# Patient Record
Sex: Male | Born: 1935
Health system: Southern US, Community
[De-identification: ages and names within clinical notes are randomized; demographics above are authoritative.]

## PROBLEM LIST (undated history)

## (undated) DIAGNOSIS — R351 Nocturia: Secondary | ICD-10-CM

## (undated) DIAGNOSIS — N400 Enlarged prostate without lower urinary tract symptoms: Secondary | ICD-10-CM

## (undated) DIAGNOSIS — I10 Essential (primary) hypertension: Secondary | ICD-10-CM

## (undated) DIAGNOSIS — K219 Gastro-esophageal reflux disease without esophagitis: Secondary | ICD-10-CM

## (undated) DIAGNOSIS — E785 Hyperlipidemia, unspecified: Secondary | ICD-10-CM

## (undated) DIAGNOSIS — C61 Malignant neoplasm of prostate: Secondary | ICD-10-CM

## (undated) HISTORY — PX: CATARACT EXTRACTION W/ INTRAOCULAR LENS  IMPLANT, BILATERAL: SHX1307

## (undated) HISTORY — PX: TONSILLECTOMY: SUR1361

## (undated) HISTORY — PX: COLONOSCOPY: SHX174

---

## 1989-07-16 HISTORY — PX: OTHER SURGICAL HISTORY: SHX169

## 1989-07-16 HISTORY — PX: INGUINAL HERNIA REPAIR: SUR1180

## 1995-11-16 HISTORY — PX: LAPAROSCOPIC CHOLECYSTECTOMY: SUR755

## 1997-11-15 HISTORY — PX: PROSTATE BIOPSY: SHX241

## 2008-03-12 ENCOUNTER — Emergency Department (HOSPITAL_COMMUNITY): Admission: EM | Admit: 2008-03-12 | Discharge: 2008-03-12 | Payer: Self-pay | Admitting: Emergency Medicine

## 2008-03-14 ENCOUNTER — Ambulatory Visit: Payer: Self-pay | Admitting: Vascular Surgery

## 2008-11-18 IMAGING — CT CT HEAD W/O CM
1 series · 16 of 30 positions shown, 20 images · non-contrast
Comparison: None

CLINICAL DATA: Dizziness, blurred vision, vomiting

CT HEAD WITHOUT CONTRAST
TECHNIQUE: Contiguous axial images were obtained from the base of
the skull through the vertex without contrast.

[Series 2: brain · axial · 0.49mm/px · z∈[+148,+290]mm · 16 of 32 slices shown, 20 images]
[im 2/32  brain]
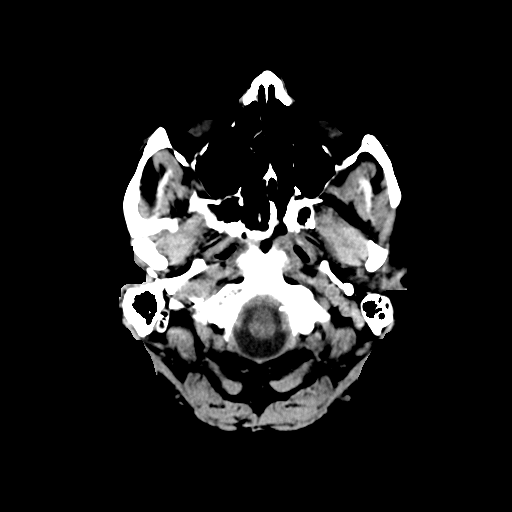
[im 2/32  bone]
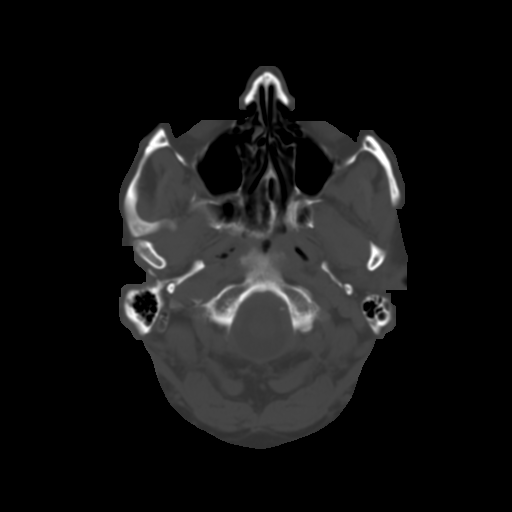
[im 4/32  brain]
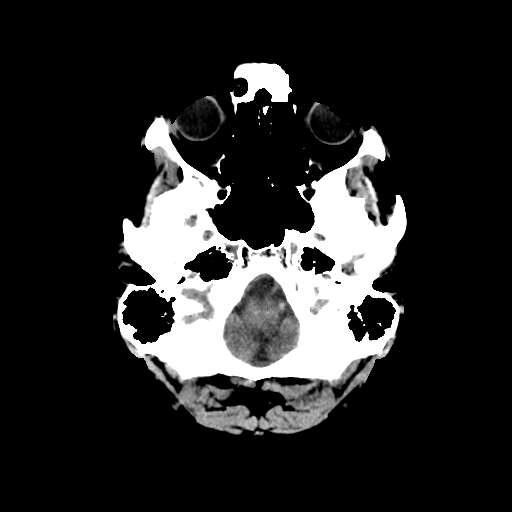
[im 6/32  brain]
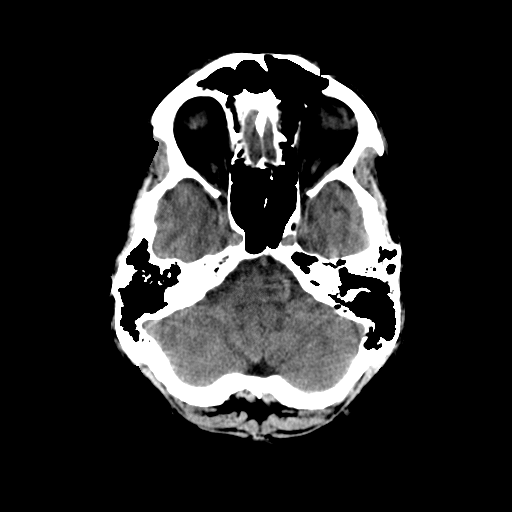
[im 8/32  brain]
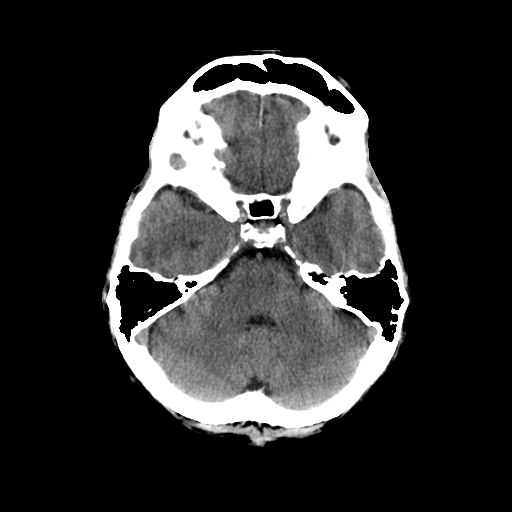
[im 9/32  brain]
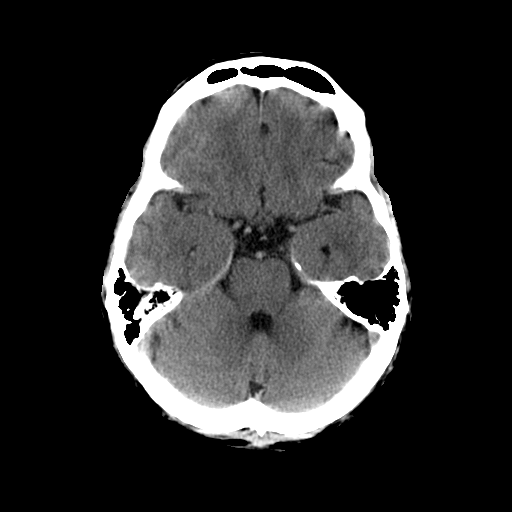
[im 9/32  bone]
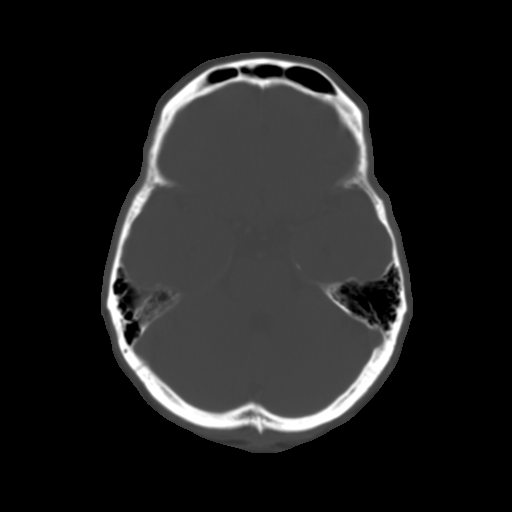
[im 11/32  brain]
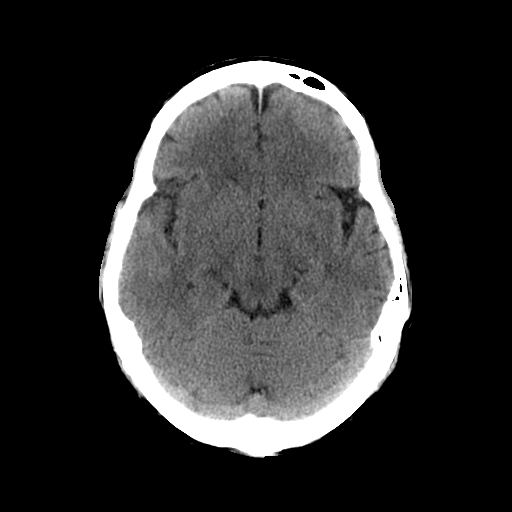
[im 13/32  brain]
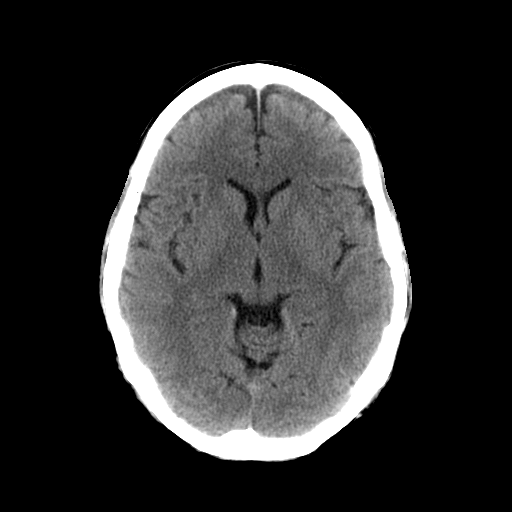
[im 15/32  brain]
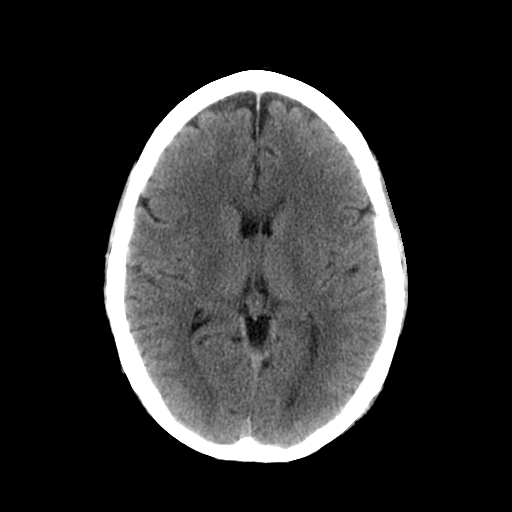
[im 17/32  brain]
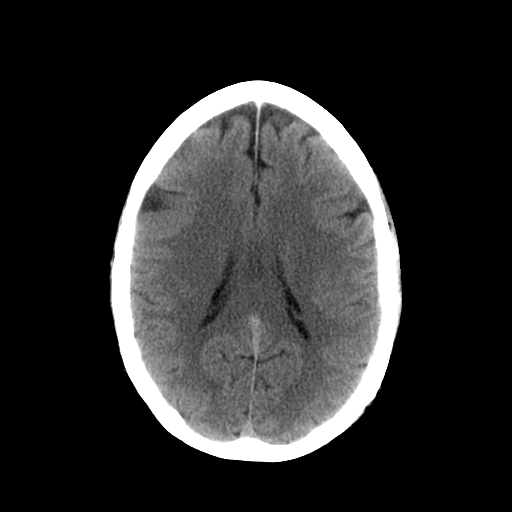
[im 17/32  bone]
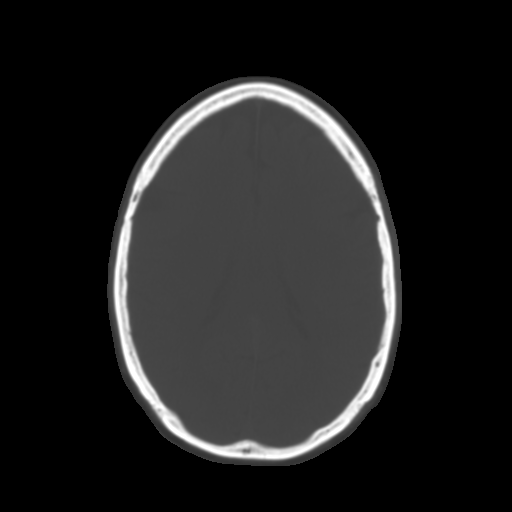
[im 19/32  brain]
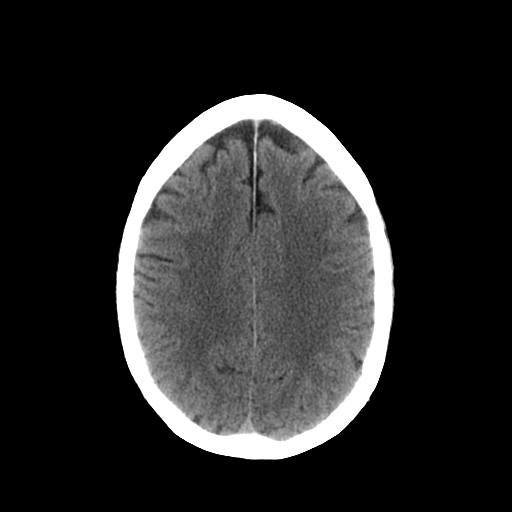
[im 21/32  brain]
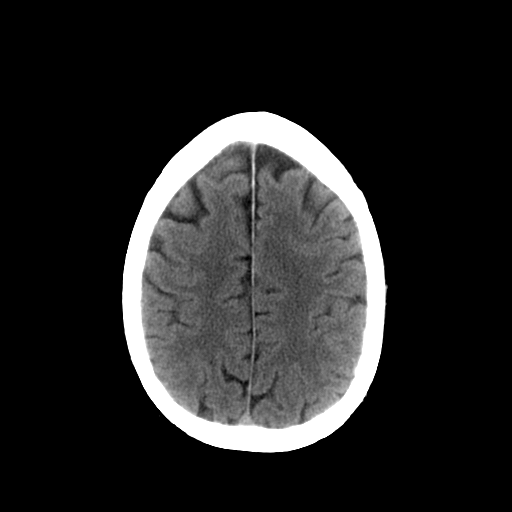
[im 23/32  brain]
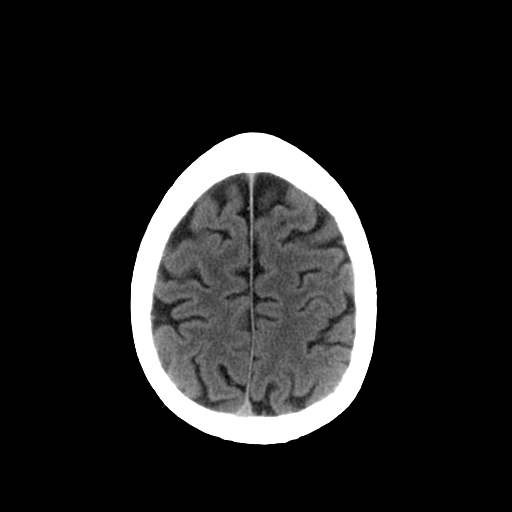
[im 24/32  brain]
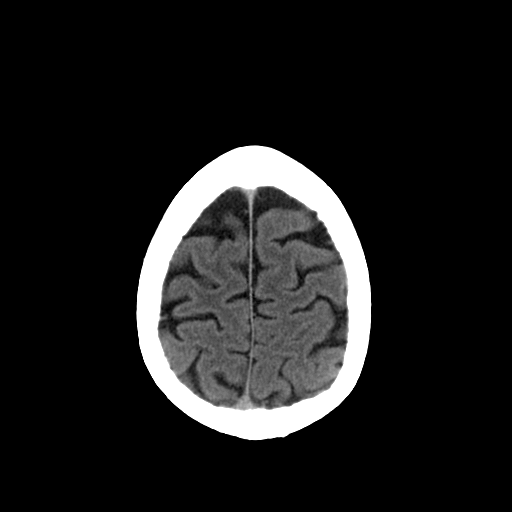
[im 24/32  bone]
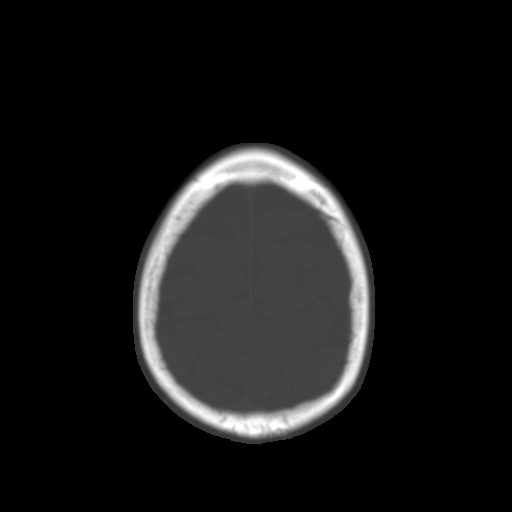
[im 26/32  brain]
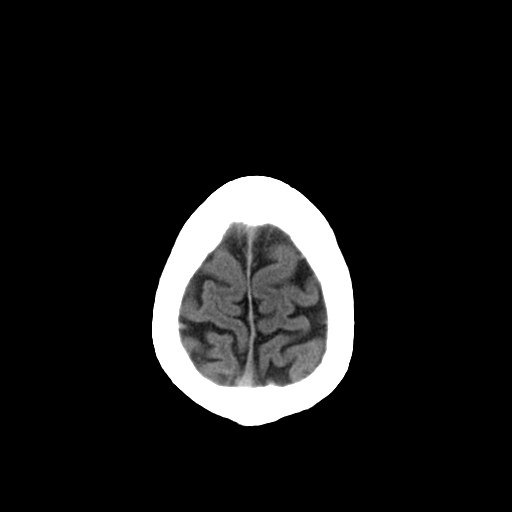
[im 28/32  brain]
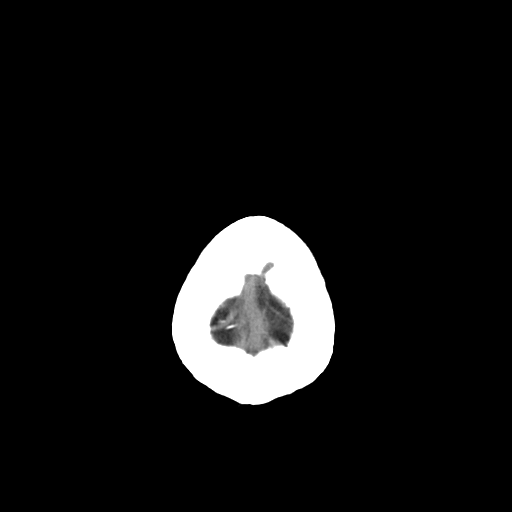
[im 30/32  brain]
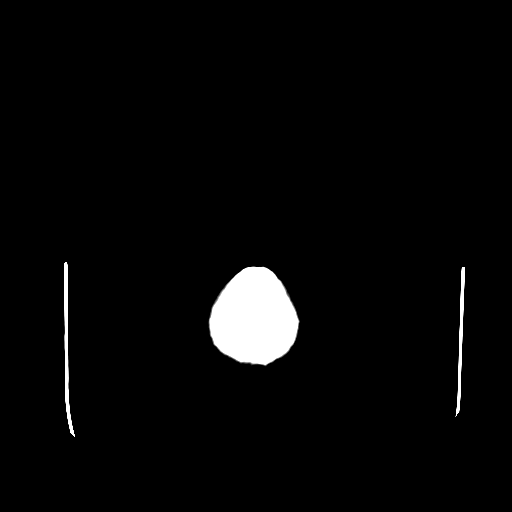

[16 of 30 positions shown; findings below may reference images not displayed]

FINDINGS: No depressed skull fracture is seen.  There is no
intracranial hemorrhage, mass effect or midline shift.  The
visualized paranasal sinuses and mastoid air cells are
unremarkable.  A tiny skull based calcification in the left
temporal lobe measures 3.5 mm.  This may represent a small
calcified meningioma.  There is no surrounding mass effect.  There
is no hydrocephalus.  The gray and white matter differentiation is
preserved.  There is no intra or extra-axial fluid collection.
IMPRESSION: No acute intracranial abnormality.

## 2011-03-30 NOTE — Procedures (Signed)
CAROTID DUPLEX EXAM   INDICATION:  Possible TIA.  The patient stated that after a 5 mile hike  approximately 1 week ago his eyes went blurry and dilated and felt  nauseous and dizzy for 1-1/2 to 2 hours.   HISTORY:  Diabetes:  No.  Cardiac:  No.  Hypertension:  Yes.  Smoking:  No.  Previous Surgery:  No.  CV History:  Amaurosis Fugax No, Paresthesias No, Hemiparesis No                                       RIGHT             LEFT  Brachial systolic pressure:         126               128  Brachial Doppler waveforms:         WNL               WNL  Vertebral direction of flow:        Antegrade         Antegrade  DUPLEX VELOCITIES (cm/sec)  CCA peak systolic                   51                80  ECA peak systolic                   81                71  ICA peak systolic                   40                60  ICA end diastolic                   14                18  PLAQUE MORPHOLOGY:                  N/A               Intimal thickening  PLAQUE AMOUNT:                      N/A               Minimal  PLAQUE LOCATION:                    N/A               Bifurcation, ICA   IMPRESSION:  1. Bilateral 1-39% ICA stenoses.  2. Patent ECAs.  3. Bilateral antegrade flow in vertebral arteries.  4. Preliminary report faxed to Dr. Rinaldo Cloud office on 03/14/2008 at      1:30 p.m.     ___________________________________________  Quita Skye. Hart Rochester, M.D.   PB/MEDQ  D:  03/14/2008  T:  03/14/2008  Job:  [id number]

## 2011-08-10 LAB — URINALYSIS, ROUTINE W REFLEX MICROSCOPIC
Glucose, UA: NEGATIVE
Specific Gravity, Urine: 1.019
pH: 5

## 2011-08-10 LAB — URINE MICROSCOPIC-ADD ON

## 2011-08-10 LAB — POCT I-STAT, CHEM 8
Calcium, Ion: 1.21
Glucose, Bld: 121 — ABNORMAL HIGH
HCT: 45
Hemoglobin: 15.3
Potassium: 4
TCO2: 30

## 2011-08-10 LAB — POCT CARDIAC MARKERS: Operator id: 196461

## 2011-08-10 LAB — DIFFERENTIAL
Basophils Relative: 0
Eosinophils Absolute: 0
Eosinophils Relative: 0
Monocytes Absolute: 0.9
Monocytes Relative: 7
Neutrophils Relative %: 86 — ABNORMAL HIGH

## 2011-08-10 LAB — CBC
MCHC: 34
MCV: 92.8
RBC: 4.64

## 2015-11-05 HISTORY — PX: PROSTATE BIOPSY: SHX241

## 2015-12-02 ENCOUNTER — Encounter: Payer: Self-pay | Admitting: Radiation Oncology

## 2015-12-02 NOTE — Progress Notes (Signed)
GU Location of Tumor / Histology: prostatic adenocarcinoma  If Prostate Cancer, Gleason Score is (3 + 4) and PSA is (13.05) on 11/05/15. Prostate volume: 94cc  Alcide Goodness with known BPH and mild stable bladder outlet obstructive symptoms. Returned to Dr. Karsten Ro with an elevated PSA in need of second TRUS/BX.  Biopsies of prostate (if applicable) revealed:    Past/Anticipated interventions by urology, if any: TRUS/BX in 1999, prescribed Flomax and Avodart (took both for a short period of time didn't feel they helped so he stopped), TRUS/BX 10/2015, referral to Dr. Tammi Klippel for consideration of radioactive seeds  Past/Anticipated interventions by medical oncology, if any: no  Weight changes, if any: no  Bowel/Bladder complaints, if any:  IPSS 3. Reports on rare occasion in the morning he will experience a weak or intermittent stream. Reports nocturia x 1. Denies hematuria or dysuria. Denies incontinence or leakage. Denies ED. Reports he hasn't had sexual intercourse in six or more months due to lack of desire and arthritis but, confirms functionality isn't an issue.  Nausea/Vomiting, if any: no  Pain issues, if any:  no  SAFETY ISSUES:  Prior radiation? no  Pacemaker/ICD? no  Possible current pregnancy? no  Is the patient on methotrexate? no  Current Complaints / other details:  80 year old male. 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

## 2015-12-03 ENCOUNTER — Ambulatory Visit
Admission: RE | Admit: 2015-12-03 | Discharge: 2015-12-03 | Disposition: A | Discharge status: Home / Self Care | Payer: Medicare Other | Source: Ambulatory Visit | Attending: Radiation Oncology | Admitting: Radiation Oncology

## 2015-12-03 ENCOUNTER — Encounter: Payer: Self-pay | Admitting: Radiation Oncology

## 2015-12-03 VITALS — BP 168/78 | HR 60 | Resp 16 | Ht 71.0 in | Wt 182.2 lb

## 2015-12-03 DIAGNOSIS — J45909 Unspecified asthma, uncomplicated: Secondary | ICD-10-CM | POA: Insufficient documentation

## 2015-12-03 DIAGNOSIS — Z7982 Long term (current) use of aspirin: Secondary | ICD-10-CM | POA: Insufficient documentation

## 2015-12-03 DIAGNOSIS — Z809 Family history of malignant neoplasm, unspecified: Secondary | ICD-10-CM | POA: Diagnosis not present

## 2015-12-03 DIAGNOSIS — F1010 Alcohol abuse, uncomplicated: Secondary | ICD-10-CM | POA: Insufficient documentation

## 2015-12-03 DIAGNOSIS — C61 Malignant neoplasm of prostate: Secondary | ICD-10-CM | POA: Diagnosis not present

## 2015-12-03 DIAGNOSIS — E7800 Pure hypercholesterolemia, unspecified: Secondary | ICD-10-CM | POA: Insufficient documentation

## 2015-12-03 DIAGNOSIS — K219 Gastro-esophageal reflux disease without esophagitis: Secondary | ICD-10-CM | POA: Insufficient documentation

## 2015-12-03 HISTORY — DX: Malignant neoplasm of prostate: C61

## 2015-12-03 HISTORY — DX: Gastro-esophageal reflux disease without esophagitis: K21.9

## 2015-12-03 NOTE — Progress Notes (Signed)
See progress note under physician encounter. 

## 2015-12-03 NOTE — Progress Notes (Signed)
Radiation Oncology         [phone] ________________________________  Initial outpatient Consultation  Name: Stephen Dominguez MRN: [id number]  Date: 12/03/2015  DOB: 07/16/1936  CC:No primary care provider on file.  Kathie Rhodes, MD   REFERRING PHYSICIAN: Kathie Rhodes, MD  DIAGNOSIS: 80 y.o. gentleman with stage T1c adenocarcinoma of the prostate with a Gleason's score of 3+4 and a PSA of 13.05    ICD-9-CM ICD-10-CM   1. Malignant neoplasm of prostate ([place]) 185 C61     HISTORY OF PRESENT ILLNESS::  Stephen Dominguez 11-02-2036: is a pleasant 80 year old male seen at the request of Dr. Karsten Ro for a new diagnosis of prostate cancer. Apparently the patient has a history of BPH which was diagnosed in 1999 following a PSA level of 5.7. Dr. Karsten Ro has followed him closely since that time and his PSA has risen to of to 3 though it responded to course of antibiotics in 2010. A transrectal ultrasound and biopsy were obtained on 11/05/2015 due to his PSA of 13.05. Ultrasound revealed his prostatic volume was 94 mL. His biopsy revealed adenocarcinoma with a Gleason score 3+4 in one core out of 12 with a final stage of T1c, intermediate risk. Although the patient was counseled on considering active surveillance, he would like to discuss low-dose brachytherapy with seed implant. He comes to discuss this further.    Marland Kitchen  PREVIOUS RADIATION THERAPY: No  PAST MEDICAL HISTORY:  has a past medical history of Prostate cancer ([place]); Asthma; GERD (gastroesophageal reflux disease); and Hypercholesterolemia.    PAST SURGICAL HISTORY: Past Surgical History  Procedure Laterality Date  . Cholecystectomy    . Prostate biopsy  1999  . Prostate biopsy  11/05/15    FAMILY HISTORY: family history includes Cancer in his mother.  SOCIAL HISTORY:  reports that he has never smoked. He has never used smokeless tobacco. He reports that he drinks alcohol. He reports that he does not use illicit drugs.  ALLERGIES: Review  of patient's allergies indicates no known allergies.  MEDICATIONS:  Current Outpatient Prescriptions  Medication Sig Dispense Refill  . aspirin 81 MG tablet Take 81 mg by mouth daily.    . cetirizine (ZYRTEC) 10 MG tablet Take 10 mg by mouth daily.    . cholecalciferol (VITAMIN D) 1000 units tablet Take 1,000 Units by mouth daily.    Marland Kitchen diltiazem (CARDIZEM CD) 180 MG 24 hr capsule Take 180 mg by mouth daily.    Marland Kitchen losartan (COZAAR) 100 MG tablet Take 100 mg by mouth daily.    Marland Kitchen omeprazole (PRILOSEC) 10 MG capsule Take 10 mg by mouth daily.    . simvastatin (ZOCOR) 40 MG tablet Take 40 mg by mouth daily.     No current facility-administered medications for this encounter.    REVIEW OF SYSTEMS:  A complete review of systems is documented in the electronic medical record. This was obtained by the nursing staff. However, I reviewed this with the patient to discuss relevant findings and make appropriate changes.  A comprehensive review of systems was negative..  The patient completed an IPSS and IIEF questionnaire.  His IPSS score was 3 indicating mild urinary outflow obstructive symptoms.  He indicated that his erectile function is able to complete sexual activity although he has not attempted in 6 months.   PHYSICAL EXAM: This patient is in no acute distress.  He is alert and oriented.   height is 5\' 11"  (1.803 m) and weight is 182 lb 3.2 oz (82.645  kg). His blood pressure is 168/78 and his pulse is 60. His respiration is 16 and oxygen saturation is 100%.  He exhibits no respiratory distress or labored breathing.  He appears neurologically intact.  His mood is pleasant.  His affect is appropriate.  Please note the digital rectal exam findings described above.  KPS = 100  100 - Normal; no complaints; no evidence of disease. 90   - Able to carry on normal activity; minor signs or symptoms of disease. 80   - Normal activity with effort; some signs or symptoms of disease. 54   - Cares for self;  unable to carry on normal activity or to do active work. 60   - Requires occasional assistance, but is able to care for most of his personal needs. 50   - Requires considerable assistance and frequent medical care. 68   - Disabled; requires special care and assistance. 41   - Severely disabled; hospital admission is indicated although death not imminent. 13   - Very sick; hospital admission necessary; active supportive treatment necessary. 10   - Moribund; fatal processes progressing rapidly. 0     - Dead  Karnofsky DA, Abelmann [place], Craver LS and Burchenal [facility] [phone]) The use of the nitrogen mustards in the palliative treatment of carcinoma: with particular reference to bronchogenic carcinoma Cancer 1 634-56   LABORATORY DATA:  Lab Results  Component Value Date   WBC 13.4* 03/12/2008   HGB 15.3 03/12/2008   HCT 45.0 03/12/2008   MCV 92.8 03/12/2008   PLT 249 03/12/2008   Lab Results  Component Value Date   NA 136 03/12/2008   K 4.0 03/12/2008   CL 101 03/12/2008   No results found for: ALT, AST, GGT, ALKPHOS, BILITOT   RADIOGRAPHY: No results found.    IMPRESSION: This gentleman is a 80 y.o. gentleman with stage T1c adenocarcinoma of the prostate with a Gleason's score of 3+4 and a PSA of 13.05.  His Gleason's Score, and PSA put him into the intermediate risk group.   He falls into a select sub-set of patients with intermediate risk disease who are eligible for seed implant with primary Gleason grade of 3 and less than half of one lobe positive for Gleason's 7 disease.  Accordingly he is eligible for a variety of potential treatment options including seed implant versus external radiotherapy.  The potential contraindication to seeds is the volume of the prostate.  If he wants seeds, he will likely need to consider androgen deprivation therapy for 3-6 months.  PLAN:Today I reviewed the findings and workup thus far.  We discussed the natural history of prostate cancer.  We reviewed the  the implications of T-stage, Gleason's Score, and PSA on decision-making and outcomes in prostate cancer.  We discussed radiation treatment in the management of prostate cancer with regard to the logistics and delivery of external beam radiation treatment as well as the logistics and delivery of prostate brachytherapy.  We compared and contrasted each of these approaches and also compared these against prostatectomy.  The patient expressed interest in prostate brachytherapy.   We discussed the role of androgen deprivation in this setting to shrink the prostate.  We discussed the pros and cons of this.  The patient wants ADT to have seeds  The patient would like to proceed with prostate brachytherapy.  I will share my findings with Dr. Karsten Ro and move forward with scheduling ADT in the near future. After 3 months, we can repeat the ultrasound volume  study.  Ideally, we want to get his gland volume under 60 mL.  I enjoyed meeting with him today, and will look forward to participating in the care of this very nice gentleman.   I spent my time face to face with the patient and more than 50% of that time was spent in counseling and/or coordination of care.   ------------------------------------------------  Sheral Apley. Tammi Klippel, M.D.

## 2015-12-04 ENCOUNTER — Telehealth: Payer: Self-pay | Admitting: *Deleted

## 2015-12-04 NOTE — Telephone Encounter (Signed)
CALLED PATIENT TO INFORM OF HORMONE INJECTION ON 12-08-15- ARRIVAL TIME - 12:45 PM @ DR. OTTELIN'S OFFICE, SPOKE WITH PATIENT AND HE IS AWARE OF THIS APPT.

## 2016-01-12 DIAGNOSIS — L821 Other seborrheic keratosis: Secondary | ICD-10-CM | POA: Diagnosis not present

## 2016-01-12 DIAGNOSIS — L570 Actinic keratosis: Secondary | ICD-10-CM | POA: Diagnosis not present

## 2016-01-12 DIAGNOSIS — Z23 Encounter for immunization: Secondary | ICD-10-CM | POA: Diagnosis not present

## 2016-01-12 DIAGNOSIS — D225 Melanocytic nevi of trunk: Secondary | ICD-10-CM | POA: Diagnosis not present

## 2016-01-12 DIAGNOSIS — D485 Neoplasm of uncertain behavior of skin: Secondary | ICD-10-CM | POA: Diagnosis not present

## 2016-01-12 DIAGNOSIS — Z85828 Personal history of other malignant neoplasm of skin: Secondary | ICD-10-CM | POA: Diagnosis not present

## 2016-01-26 DIAGNOSIS — Z125 Encounter for screening for malignant neoplasm of prostate: Secondary | ICD-10-CM | POA: Diagnosis not present

## 2016-01-26 DIAGNOSIS — E559 Vitamin D deficiency, unspecified: Secondary | ICD-10-CM | POA: Diagnosis not present

## 2016-01-26 DIAGNOSIS — R7309 Other abnormal glucose: Secondary | ICD-10-CM | POA: Diagnosis not present

## 2016-01-26 DIAGNOSIS — E784 Other hyperlipidemia: Secondary | ICD-10-CM | POA: Diagnosis not present

## 2016-01-26 DIAGNOSIS — N183 Chronic kidney disease, stage 3 (moderate): Secondary | ICD-10-CM | POA: Diagnosis not present

## 2016-01-30 DIAGNOSIS — N183 Chronic kidney disease, stage 3 (moderate): Secondary | ICD-10-CM | POA: Diagnosis not present

## 2016-01-30 DIAGNOSIS — E559 Vitamin D deficiency, unspecified: Secondary | ICD-10-CM | POA: Diagnosis not present

## 2016-01-30 DIAGNOSIS — R7309 Other abnormal glucose: Secondary | ICD-10-CM | POA: Diagnosis not present

## 2016-01-30 DIAGNOSIS — Z0000 Encounter for general adult medical examination without abnormal findings: Secondary | ICD-10-CM | POA: Diagnosis not present

## 2016-01-30 DIAGNOSIS — Z6826 Body mass index (BMI) 26.0-26.9, adult: Secondary | ICD-10-CM | POA: Diagnosis not present

## 2016-01-30 DIAGNOSIS — I10 Essential (primary) hypertension: Secondary | ICD-10-CM | POA: Diagnosis not present

## 2016-01-30 DIAGNOSIS — K219 Gastro-esophageal reflux disease without esophagitis: Secondary | ICD-10-CM | POA: Diagnosis not present

## 2016-01-30 DIAGNOSIS — Z1389 Encounter for screening for other disorder: Secondary | ICD-10-CM | POA: Diagnosis not present

## 2016-01-30 DIAGNOSIS — C61 Malignant neoplasm of prostate: Secondary | ICD-10-CM | POA: Diagnosis not present

## 2016-01-30 DIAGNOSIS — E784 Other hyperlipidemia: Secondary | ICD-10-CM | POA: Diagnosis not present

## 2016-03-01 DIAGNOSIS — C61 Malignant neoplasm of prostate: Secondary | ICD-10-CM | POA: Diagnosis not present

## 2016-03-08 DIAGNOSIS — N138 Other obstructive and reflux uropathy: Secondary | ICD-10-CM | POA: Diagnosis not present

## 2016-03-08 DIAGNOSIS — N401 Enlarged prostate with lower urinary tract symptoms: Secondary | ICD-10-CM | POA: Diagnosis not present

## 2016-03-08 DIAGNOSIS — C61 Malignant neoplasm of prostate: Secondary | ICD-10-CM | POA: Diagnosis not present

## 2016-03-23 ENCOUNTER — Encounter: Payer: Self-pay | Admitting: Radiation Oncology

## 2016-03-23 ENCOUNTER — Ambulatory Visit
Admission: RE | Admit: 2016-03-23 | Discharge: 2016-03-23 | Disposition: A | Discharge status: Home / Self Care | Payer: Medicare Other | Source: Ambulatory Visit | Attending: Radiation Oncology | Admitting: Radiation Oncology

## 2016-03-23 VITALS — BP 141/86 | HR 64 | Resp 16 | Ht 71.0 in | Wt 183.1 lb

## 2016-03-23 DIAGNOSIS — C61 Malignant neoplasm of prostate: Secondary | ICD-10-CM

## 2016-03-23 DIAGNOSIS — Z510 Encounter for antineoplastic radiation therapy: Secondary | ICD-10-CM | POA: Diagnosis not present

## 2016-03-23 NOTE — Progress Notes (Signed)
Radiation Oncology         [phone] ________________________________  Initial outpatient Re-Consultation  Name: Stephen Dominguez MRN: [id number]  Date: 03/23/2016  DOB: [birth year]  CC:No primary care provider on file.  Kathie Rhodes, MD   REFERRING PHYSICIAN: Kathie Rhodes, MD  DIAGNOSIS: 80 y.o. gentleman with stage T1c adenocarcinoma of the prostate with a Gleason's score of 3+4 and a PSA of 13.05    ICD-9-CM ICD-10-CM   1. Malignant neoplasm of prostate ([place]) 185 C61     HISTORY OF PRESENT ILLNESS:  Stephen Dominguez is a pleasant 80 y.o. male originally seen on 12/03/15 at the request of Dr. Karsten Ro for a new diagnosis of prostate cancer. Apparently the patient has a history of BPH which was diagnosed in 1999 at which time he had a PSA level of 5.7. Dr. Karsten Ro has followed him closely since that time and his PSA rose to18 though it responded to course of antibiotics in 2010. A transrectal ultrasound and biopsy were obtained on 11/05/2015 due to his PSA of 13.05. Ultrasound revealed his prostatic volume was 94 mL. His biopsy revealed adenocarcinoma with a Gleason score 3+4 in one core out of 12 with a final stage of T1c, intermediate risk.   The patient originally presented to clinic interested in prostate brachytherapy. At the time, his prostate volume was too large for the procedure. He was interested in becoming a candidate with ADT, and he received a Lupron injection on 12/08/2015. Repeat prostate ultrasound on 03/08/16 showed the prostate had decreased in size to 56.9 mL.    Marland Kitchen  PREVIOUS RADIATION THERAPY: No  PAST MEDICAL HISTORY:  Past Medical History  Diagnosis Date  . Prostate cancer ([place])   . Asthma   . GERD (gastroesophageal reflux disease)   . Hypercholesterolemia      PAST SURGICAL HISTORY: Past Surgical History  Procedure Laterality Date  . Cholecystectomy    . Prostate biopsy  1999  . Prostate biopsy  11/05/15    FAMILY HISTORY:  Family History  Problem  Relation Age of Onset  . Cancer Mother     breast and colon    SOCIAL HISTORY:  reports that he has never smoked. He has never used smokeless tobacco. He reports that he drinks alcohol. He reports that he does not use illicit drugs. The patient is married and resides in [place]. He is retired but enjoys fishing and travelling with his wife.  ALLERGIES: Review of patient's allergies indicates no known allergies.  MEDICATIONS:  Current Outpatient Prescriptions  Medication Sig Dispense Refill  . aspirin 81 MG tablet Take 81 mg by mouth daily.    . cetirizine (ZYRTEC) 10 MG tablet Take 10 mg by mouth daily.    . cholecalciferol (VITAMIN D) 1000 units tablet Take 1,000 Units by mouth daily.    Marland Kitchen diltiazem (CARDIZEM CD) 180 MG 24 hr capsule Take 180 mg by mouth daily.    Marland Kitchen losartan (COZAAR) 100 MG tablet Take 100 mg by mouth daily.    Marland Kitchen omeprazole (PRILOSEC) 10 MG capsule Take 10 mg by mouth daily.    . simvastatin (ZOCOR) 40 MG tablet Take 40 mg by mouth daily.     No current facility-administered medications for this encounter.    REVIEW OF SYSTEMS:  On review of systems, the patient reports that he is doing well overall. He denies any chest pain, shortness of breath, cough, fevers, chills, night sweats, unintended weight changes. He denies any bowel disturbances, and denies abdominal  pain, nausea or vomiting. He denies any new musculoskeletal or joint aches or pains. His IPSS score was 2 indicating mild urinary outflow obstructive symptoms which include nocturia 1, and rare week or intermittent stream.  He indicated that his erectile function is able to complete sexual activity although he has not attempted in 6 months. A complete review of systems is obtained and is otherwise negative.    PHYSICAL EXAM:   height is 5\' 11"  (1.803 m) and weight is 183 lb 1.6 oz (83.054 kg). His blood pressure is 141/86 and his pulse is 64. His respiration is 16 and oxygen saturation is 100%.   Pain scale  0/10 In general this is a well appearing Caucasian male in no acute distress. He's alert and oriented x4 and appropriate throughout the examination. Cardiopulmonary assessment is negative for acute distress and he exhibits normal effort.    KPS = 100  100 - Normal; no complaints; no evidence of disease. 90   - Able to carry on normal activity; minor signs or symptoms of disease. 80   - Normal activity with effort; some signs or symptoms of disease. 4   - Cares for self; unable to carry on normal activity or to do active work. 60   - Requires occasional assistance, but is able to care for most of his personal needs. 50   - Requires considerable assistance and frequent medical care. 19   - Disabled; requires special care and assistance. 11   - Severely disabled; hospital admission is indicated although death not imminent. 44   - Very sick; hospital admission necessary; active supportive treatment necessary. 10   - Moribund; fatal processes progressing rapidly. 0     - Dead  Karnofsky DA, Abelmann [place], Craver LS and Burchenal [facility] [phone]) The use of the nitrogen mustards in the palliative treatment of carcinoma: with particular reference to bronchogenic carcinoma Cancer 1 634-56   LABORATORY DATA:  Lab Results  Component Value Date   WBC 13.4* 03/12/2008   HGB 15.3 03/12/2008   HCT 45.0 03/12/2008   MCV 92.8 03/12/2008   PLT 249 03/12/2008   Lab Results  Component Value Date   NA 136 03/12/2008   K 4.0 03/12/2008   CL 101 03/12/2008   No results found for: ALT, AST, GGT, ALKPHOS, BILITOT   RADIOGRAPHY: No results found.    IMPRESSION: This gentleman is a 80 y.o. male with stage T1c adenocarcinoma of the prostate with a Gleason's score of 3+4 and a PSA of 13.05.  His Gleason's Score, and PSA put him into the intermediate risk group.   He falls into a select sub-set of patients with intermediate risk disease who are eligible for seed implant with primary Gleason grade of 3 and less  than half of one lobe positive for Gleason's 7 disease.  Accordingly he continues to be eligible for radiotherapy, and now that his prostatic volume has decreased, he would be an appropriate candidate for brachytherapy.  PLAN: Dr. Tammi Klippel again discussed that based on his prosthetic volume, and that he would not be a candidate for radioactive seed implant radiotherapy treatment. We did discuss that his Lupron injection sounds to been a 3 month milligrams strength, and if this is truly the case, Dr. Tammi Klippel recommends consideration of another injection to stabilize his prostatic volume and avoid this from becoming problematic and excluding him again from being a candidate for seed implant.  We discussed radiation treatment in the management of prostate cancer with regard to  the logistics and delivery of external beam radiation treatment as well as the logistics and delivery of prostate brachytherapy.  The patient continues to be interested in moving forward with radioactive seed implant.  I have contacted Dr. Simone Curia office and have discussed her concerns about needing an additional Lupron injection. They will contact the patient for scheduling, and we discussed moving forward with scheduling his pubic arch study and the prostate brachytherapy surgery for July to accommodate for his upcoming vacation.   The above documentation reflects my direct findings during this shared patient visit. Please see the separate note by Dr. Tammi Klippel on this date for the remainder of the patient's plan of care.    Carola Rhine, PAC  This document serves as a record of services personally performed by Shona Simpson, [facility] Tyler Pita, MD. It was created on their behalf by Arlyce Harman, a trained medical scribe. The creation of this record is based on the scribe's personal observations and the provider's statements to them. This document has been checked and approved by the attending provider.

## 2016-03-23 NOTE — Progress Notes (Signed)
GU Location of Tumor / Histology: prostatic adenocarcinoma  If Prostate Cancer, Gleason Score is (3 + 4) and PSA is (13.05) on 11/05/15. Prostate volume prior to ADT: 94cc. Current prostate volume: 56.85 cc s/p Lupron given 12/08/15.  Alcide Goodness with known BPH and mild stable bladder outlet obstructive symptoms. Returned to Dr. Karsten Ro with an elevated PSA in need of second TRUS/BX.  Biopsies of prostate (if applicable) revealed:    Past/Anticipated interventions by urology, if any: TRUS/BX in 1999, prescribed Flomax and Avodart (took both for a short period of time didn't feel they helped so he stopped), TRUS/BX 10/2015, referral to Dr. Tammi Klippel for consideration of radioactive seeds  Past/Anticipated interventions by medical oncology, if any: no  Weight changes, if any: no  Bowel/Bladder complaints, if any: IPSS 2. Reports on rare occasion in the morning he will experience a weak or intermittent stream. Reports nocturia x 1. Denies hematuria or dysuria. Denies incontinence or leakage. Denies ED. Reports he hasn't had sexual intercourse in six or more months due to lack of desire and arthritis but, confirms functionality isn't an issue.  Nausea/Vomiting, if any: no  Pain issues, if any: no  SAFETY ISSUES:  Prior radiation? no  Pacemaker/ICD? no  Possible current pregnancy? no  Is the patient on methotrexate? no  Current Complaints / other details: 80 year old male. 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

## 2016-03-23 NOTE — Progress Notes (Signed)
Patient reports he will be available to have a seed implant the end of May or in July. Patient reports he is going to [place] in June.

## 2016-03-25 ENCOUNTER — Telehealth: Payer: Self-pay | Admitting: *Deleted

## 2016-03-25 NOTE — Telephone Encounter (Signed)
Called patient to inform of pre-seed appt. For 04-16-16, spoke with patient and he is aware of this appt.

## 2016-04-01 ENCOUNTER — Other Ambulatory Visit: Payer: Self-pay | Admitting: Urology

## 2016-04-02 ENCOUNTER — Telehealth: Payer: Self-pay | Admitting: *Deleted

## 2016-04-02 NOTE — Telephone Encounter (Signed)
CALLED PATIENT TO INFORM OF IMPLANT DATE, SPOKE WITH PATIENT AND HE IS AWARE OF THIS IMPLANT 

## 2016-04-06 DIAGNOSIS — C61 Malignant neoplasm of prostate: Secondary | ICD-10-CM | POA: Diagnosis not present

## 2016-04-15 ENCOUNTER — Telehealth: Payer: Self-pay | Admitting: *Deleted

## 2016-04-15 NOTE — Telephone Encounter (Signed)
CALLED PATIENT TO REMIND OF APPT. FOR 04-16-16, SPOKE WITH PATIENT'S WIFE ADDREN AND SHE IS AWARE OF THIS APPT.

## 2016-04-16 ENCOUNTER — Ambulatory Visit
Admission: RE | Admit: 2016-04-16 | Discharge: 2016-04-16 | Disposition: A | Discharge status: Home / Self Care | Payer: Medicare Other | Source: Ambulatory Visit | Attending: Radiation Oncology | Admitting: Radiation Oncology

## 2016-04-16 ENCOUNTER — Ambulatory Visit (HOSPITAL_COMMUNITY)
Admission: RE | Admit: 2016-04-16 | Discharge: 2016-04-16 | Disposition: A | Discharge status: Home / Self Care | Payer: Medicare Other | Source: Ambulatory Visit | Attending: Urology | Admitting: Urology

## 2016-04-16 ENCOUNTER — Other Ambulatory Visit: Payer: Self-pay

## 2016-04-16 DIAGNOSIS — C61 Malignant neoplasm of prostate: Secondary | ICD-10-CM | POA: Diagnosis not present

## 2016-04-16 DIAGNOSIS — Z01818 Encounter for other preprocedural examination: Secondary | ICD-10-CM | POA: Diagnosis not present

## 2016-04-16 DIAGNOSIS — Z510 Encounter for antineoplastic radiation therapy: Secondary | ICD-10-CM | POA: Diagnosis not present

## 2016-04-16 NOTE — Progress Notes (Signed)
  Radiation Oncology         [phone] ________________________________  Name: Stephen Dominguez MRN: [id number]  Date: 04/16/2016  DOB: 10/05/1936  SIMULATION AND TREATMENT PLANNING NOTE PUBIC ARCH STUDY  CC:No primary care provider on file.  Kathie Rhodes, MD  DIAGNOSIS: 80 yo male with adenocarcinoma of the prostate with a Gleason score of 3+4 and PSA of 13.05    ICD-9-CM ICD-10-CM   1. Malignant neoplasm of prostate ([place]) [place]:  The patient presented today for evaluation for possible prostate seed implant. He was brought to the radiation planning suite and placed supine on the CT couch. A 3-dimensional image study set was obtained in upload to the planning computer. There, on each axial slice, I contoured the prostate gland. Then, using three-dimensional radiation planning tools I reconstructed the prostate in view of the structures from the transperineal needle pathway to assess for possible pubic arch interference. In doing so, I did not appreciate any pubic arch interference. Also, the patient's prostate volume was estimated based on the drawn structure. The volume was 65 cc.  Given the pubic arch appearance and prostate volume, patient remains a good candidate to proceed with prostate seed implant. Today, he freely provided informed written consent to proceed.    PLAN: The patient will undergo prostate seed implant.   ________________________________  Sheral Apley. Tammi Klippel, M.D.    This document serves as a record of services personally performed by Tyler Pita, MD. It was created on his behalf by Lendon Collar, a trained medical scribe. The creation of this record is based on the scribe's personal observations and the provider's statements to them. This document has been checked and approved by the attending provider.

## 2016-04-17 NOTE — Progress Notes (Signed)
Radiation Oncology         [phone] ________________________________  Name: Stephen Dominguez MRN: [id number]  Date: 04/16/2016  DOB: [birth year]  Pre-Seed Follow Up.  CC:No primary care provider on file.  Kathie Rhodes, MD  DIAGNOSIS:  Intermediate risk T1c adenocarcinoma of the prostate with Gleason's score of 3+4 and PSA Of 13.05    ICD-9-CM ICD-10-CM   1. Malignant neoplasm of prostate ([place]) [place]    Narrative:  The patient returns today for routine follow-up in anticipation of radioactive seed implant for his intermediate risk prostate cancer. When we originally met his IPSS score was 3, and his gland size was 94.42cc. He received Lupron in January 2017 and his prostate was re-assessed in April 2017 and the size was about 56 cc. Today on pubic arch study his gland is unobstructed by his pubic arches and his gland measures 65cc.   On review of systems, the patient reports that he is doing well overall. He denies any chest pain, shortness of breath, cough, fevers, chills, night sweats, unintended weight changes. He denies any bowel or bladder disturbances, and denies abdominal pain, nausea or vomiting. He denies any new musculoskeletal or joint aches or pains. A complete review of systems is obtained and is otherwise negative.   Past Medical History:  Past Medical History  Diagnosis Date  . Prostate cancer ([place])   . Asthma   . GERD (gastroesophageal reflux disease)   . Hypercholesterolemia     Past Surgical History: Past Surgical History  Procedure Laterality Date  . Cholecystectomy    . Prostate biopsy  1999  . Prostate biopsy  11/05/15    Social History:  Social History   Social History  . Marital Status: Married    Spouse Name: N/A  . Number of Children: N/A  . Years of Education: N/A   Occupational History  . Not on file.   Social History Main Topics  . Smoking status: Never Smoker   . Smokeless tobacco: Never Used  . Alcohol Use: Yes     Comment: 2-3/week   . Drug Use: No  . Sexual Activity: Not Currently   Other Topics Concern  . Not on file   Social History Narrative    Family History: Family History  Problem Relation Age of Onset  . Cancer Mother     breast and colon    ALLERGIES:  has No Known Allergies.  Meds: Current Outpatient Prescriptions  Medication Sig Dispense Refill  . aspirin 81 MG tablet Take 81 mg by mouth daily.    . cetirizine (ZYRTEC) 10 MG tablet Take 10 mg by mouth daily.    . cholecalciferol (VITAMIN D) 1000 units tablet Take 1,000 Units by mouth daily.    Marland Kitchen diltiazem (CARDIZEM CD) 180 MG 24 hr capsule Take 180 mg by mouth daily.    Marland Kitchen losartan (COZAAR) 100 MG tablet Take 100 mg by mouth daily.    Marland Kitchen omeprazole (PRILOSEC) 10 MG capsule Take 10 mg by mouth daily.    . simvastatin (ZOCOR) 40 MG tablet Take 40 mg by mouth daily.     No current facility-administered medications for this encounter.    Physical Findings:  vitals were not taken for this visit..   In general this is a well appearing caucasian male in no acute distress. He's alert and oriented x4 and appropriate throughout the examination. Cardiopulmonary assessment is negative for acute distress and he exhibits normal effort.   Lab Findings: Lab Results  Component Value Date   WBC 13.4* 03/12/2008   HGB 15.3 03/12/2008   HCT 45.0 03/12/2008   MCV 92.8 03/12/2008   PLT 249 03/12/2008     Radiographic Findings: Dg Chest 2 View  04/16/2016  CLINICAL DATA:  Preoperative examination prior prostate seed implant placement; no cardiopulmonary history. EXAM: CHEST  2 VIEW COMPARISON:  PA and lateral chest x-ray of March 12, 2008 FINDINGS: The lungs are well-expanded. There is no focal infiltrate. There are no pulmonary parenchymal nodules. There is no pleural effusion. The heart and pulmonary vascularity are normal. The mediastinum is normal in width. The bony thorax is unremarkable. IMPRESSION: There is no active cardiopulmonary disease.  Electronically Signed   By: David  Martinique M.D.   On: 04/16/2016 13:57    Impression/Plan: 1.  Intermediate risk T1c adenocarcinoma of the prostate with Gleason's score of 3+4 and PSA Of 13.05. Dr. Tammi Klippel discusses the pubic arch study and the size of the prostate. The patient is to undergo repeat Lupron injection to ensure his prostate will remain within appropriate size for his procedure. He is scheduled for the seed implant on 05/11/16. Again we reviewed the risks, benefits, recovery, and perioperative expectations. He is interested in moving forward, signs written consent for this procedure with Dr. Tammi Klippel.  At the end of the discussion, he questions were answered to his satisfaction.  The above documentation reflects my direct findings during this shared patient visit. Please see the separate note by Dr. Tammi Klippel on this date for the remainder of the patient's plan of care.    Carola Rhine, PAC

## 2016-05-25 ENCOUNTER — Encounter (HOSPITAL_BASED_OUTPATIENT_CLINIC_OR_DEPARTMENT_OTHER): Payer: Self-pay | Admitting: *Deleted

## 2016-05-25 ENCOUNTER — Ambulatory Visit: Admission: RE | Admit: 2016-05-25 | Payer: Medicare Other | Source: Ambulatory Visit

## 2016-05-25 NOTE — Progress Notes (Signed)
NPO AFTER MN.  ARRIVE AT 0600.  GETTING LAB WORK DONE Thursday 05-27-2016.  CURRENT CXR AND EKG IN CHART AND EPIC.  WILL TAKE  AM MEDS DOS W/ SIPS OF WATER AND DO FLEET ENEMA .

## 2016-05-26 ENCOUNTER — Telehealth: Payer: Self-pay | Admitting: *Deleted

## 2016-05-26 NOTE — Telephone Encounter (Signed)
CALLED PATIENT TO REMIND OF LABS (05/27/16)  FOR HIS IMPLANT ON 06-03-16, LVM FOR A RETURN CALL

## 2016-05-27 DIAGNOSIS — Z510 Encounter for antineoplastic radiation therapy: Secondary | ICD-10-CM | POA: Diagnosis not present

## 2016-05-27 LAB — CBC
HCT: 38 % — ABNORMAL LOW (ref 39.0–52.0)
Hemoglobin: 12.5 g/dL — ABNORMAL LOW (ref 13.0–17.0)
MCH: 31 pg (ref 26.0–34.0)
MCHC: 32.9 g/dL (ref 30.0–36.0)
MCV: 94.3 fL (ref 78.0–100.0)
Platelets: 320 10*3/uL (ref 150–400)
RBC: 4.03 MIL/uL — ABNORMAL LOW (ref 4.22–5.81)
RDW: 12.5 % (ref 11.5–15.5)
WBC: 5.9 10*3/uL (ref 4.0–10.5)

## 2016-05-27 LAB — COMPREHENSIVE METABOLIC PANEL
ALT: 18 U/L (ref 17–63)
AST: 23 U/L (ref 15–41)
Albumin: 4.2 g/dL (ref 3.5–5.0)
Alkaline Phosphatase: 80 U/L (ref 38–126)
Anion gap: 7 (ref 5–15)
BUN: 26 mg/dL — ABNORMAL HIGH (ref 6–20)
CO2: 26 mmol/L (ref 22–32)
Calcium: 9.4 mg/dL (ref 8.9–10.3)
Chloride: 106 mmol/L (ref 101–111)
Creatinine, Ser: 1.31 mg/dL — ABNORMAL HIGH (ref 0.61–1.24)
GFR calc Af Amer: 59 mL/min — ABNORMAL LOW (ref >60)
GFR calc non Af Amer: 51 mL/min — ABNORMAL LOW (ref >60)
Glucose, Bld: 111 mg/dL — ABNORMAL HIGH (ref 65–99)
Potassium: 4.5 mmol/L (ref 3.5–5.1)
Sodium: 139 mmol/L (ref 135–145)
Total Bilirubin: 0.7 mg/dL (ref 0.3–1.2)
Total Protein: 6.9 g/dL (ref 6.5–8.1)

## 2016-05-27 LAB — PROTIME-INR
INR: 1.11 (ref 0.00–1.49)
Prothrombin Time: 14.1 seconds (ref 11.6–15.2)

## 2016-05-27 LAB — APTT: aPTT: 28 seconds (ref 24–37)

## 2016-06-01 NOTE — Anesthesia Preprocedure Evaluation (Signed)
Anesthesia Evaluation  Patient identified by MRN, date of birth, ID band Patient awake    Reviewed: Allergy & Precautions, H&P , Patient's Chart, lab work & pertinent test results, reviewed documented beta blocker date and time   Airway Mallampati: II  TM Distance: >3 FB Neck ROM: full    Dental no notable dental hx.    Pulmonary    Pulmonary exam normal breath sounds clear to auscultation       Cardiovascular hypertension, On Medications  Rhythm:regular Rate:Normal     Neuro/Psych    GI/Hepatic GERD  ,  Endo/Other    Renal/GU      Musculoskeletal   Abdominal   Peds  Hematology   Anesthesia Other Findings   Reproductive/Obstetrics                             Anesthesia Physical Anesthesia Plan  ASA: III  Anesthesia Plan:    Post-op Pain Management:    Induction: Intravenous  Airway Management Planned: LMA  Additional Equipment:   Intra-op Plan:   Post-operative Plan:   Informed Consent: I have reviewed the patients History and Physical, chart, labs and discussed the procedure including the risks, benefits and alternatives for the proposed anesthesia with the patient or authorized representative who has indicated his/her understanding and acceptance.   Dental Advisory Given and Dental advisory given  Plan Discussed with: CRNA and Surgeon  Anesthesia Plan Comments: (Discussed GA with LMA, possible sore throat, potential need to switch to ETT, N/V, pulmonary aspiration. Questions answered. )        Anesthesia Quick Evaluation

## 2016-06-02 ENCOUNTER — Telehealth: Payer: Self-pay | Admitting: *Deleted

## 2016-06-02 NOTE — Telephone Encounter (Signed)
CALLED PATIENT TO REMIND OF IMPLANT FOR 06-03-16, SPOKE WITH PATIENT'S WIFE AND THEY ARE AWARE OF THIS IMPLANT.

## 2016-06-03 ENCOUNTER — Encounter (HOSPITAL_BASED_OUTPATIENT_CLINIC_OR_DEPARTMENT_OTHER): Payer: Self-pay

## 2016-06-03 ENCOUNTER — Ambulatory Visit (HOSPITAL_BASED_OUTPATIENT_CLINIC_OR_DEPARTMENT_OTHER): Payer: Medicare Other | Admitting: Anesthesiology

## 2016-06-03 ENCOUNTER — Ambulatory Visit (HOSPITAL_COMMUNITY): Payer: Medicare Other

## 2016-06-03 ENCOUNTER — Encounter (HOSPITAL_BASED_OUTPATIENT_CLINIC_OR_DEPARTMENT_OTHER)
Admission: RE | Disposition: A | Discharge status: Home / Self Care | Payer: Self-pay | Source: Ambulatory Visit | Attending: Urology

## 2016-06-03 ENCOUNTER — Ambulatory Visit (HOSPITAL_BASED_OUTPATIENT_CLINIC_OR_DEPARTMENT_OTHER)
Admission: RE | Admit: 2016-06-03 | Discharge: 2016-06-03 | Disposition: A | Discharge status: Home / Self Care | Payer: Medicare Other | Source: Ambulatory Visit | Attending: Urology | Admitting: Urology

## 2016-06-03 DIAGNOSIS — I10 Essential (primary) hypertension: Secondary | ICD-10-CM | POA: Insufficient documentation

## 2016-06-03 DIAGNOSIS — C61 Malignant neoplasm of prostate: Secondary | ICD-10-CM | POA: Diagnosis not present

## 2016-06-03 DIAGNOSIS — E7800 Pure hypercholesterolemia, unspecified: Secondary | ICD-10-CM | POA: Diagnosis not present

## 2016-06-03 DIAGNOSIS — Z7982 Long term (current) use of aspirin: Secondary | ICD-10-CM | POA: Diagnosis not present

## 2016-06-03 DIAGNOSIS — Z79899 Other long term (current) drug therapy: Secondary | ICD-10-CM | POA: Diagnosis not present

## 2016-06-03 DIAGNOSIS — K219 Gastro-esophageal reflux disease without esophagitis: Secondary | ICD-10-CM | POA: Diagnosis not present

## 2016-06-03 HISTORY — PX: RADIOACTIVE SEED IMPLANT: SHX5150

## 2016-06-03 HISTORY — DX: Hyperlipidemia, unspecified: E78.5

## 2016-06-03 HISTORY — DX: Benign prostatic hyperplasia without lower urinary tract symptoms: N40.0

## 2016-06-03 HISTORY — DX: Nocturia: R35.1

## 2016-06-03 HISTORY — PX: CYSTOSCOPY: SHX5120

## 2016-06-03 HISTORY — DX: Essential (primary) hypertension: I10

## 2016-06-03 SURGERY — INSERTION, RADIATION SOURCE, PROSTATE
Anesthesia: General | Site: Prostate

## 2016-06-03 MED ORDER — LIDOCAINE HCL (CARDIAC) 20 MG/ML IV SOLN
INTRAVENOUS | Status: AC
  Filled 2016-06-03: qty 5

## 2016-06-03 MED ORDER — FENTANYL CITRATE (PF) 100 MCG/2ML IJ SOLN
INTRAMUSCULAR | Status: DC | PRN
  Administered 2016-06-03 (×2): 50 ug via INTRAVENOUS

## 2016-06-03 MED ORDER — PHENYLEPHRINE HCL 10 MG/ML IJ SOLN
INTRAMUSCULAR | Status: DC | PRN
  Administered 2016-06-03 (×2): 80 ug via INTRAVENOUS
  Administered 2016-06-03: 40 ug via INTRAVENOUS

## 2016-06-03 MED ORDER — HYDROCODONE-ACETAMINOPHEN 7.5-325 MG PO TABS: 1.0000 | ORAL_TABLET | ORAL | Status: AC | PRN

## 2016-06-03 MED ORDER — ONDANSETRON HCL 4 MG/2ML IJ SOLN
INTRAMUSCULAR | Status: DC | PRN
  Administered 2016-06-03: 4 mg via INTRAVENOUS

## 2016-06-03 MED ORDER — FLEET ENEMA 7-19 GM/118ML RE ENEM
1.0000 | ENEMA | Freq: Once | RECTAL | Status: DC
  Filled 2016-06-03: qty 1

## 2016-06-03 MED ORDER — MIDAZOLAM HCL 2 MG/2ML IJ SOLN
INTRAMUSCULAR | Status: AC
  Filled 2016-06-03: qty 2

## 2016-06-03 MED ORDER — LACTATED RINGERS IV SOLN
INTRAVENOUS | Status: DC
  Administered 2016-06-03 (×2): via INTRAVENOUS
  Filled 2016-06-03: qty 1000

## 2016-06-03 MED ORDER — EPHEDRINE SULFATE 50 MG/ML IJ SOLN
INTRAMUSCULAR | Status: DC | PRN
  Administered 2016-06-03 (×2): 25 mg via INTRAVENOUS

## 2016-06-03 MED ORDER — PHENYLEPHRINE 40 MCG/ML (10ML) SYRINGE FOR IV PUSH (FOR BLOOD PRESSURE SUPPORT)
PREFILLED_SYRINGE | INTRAVENOUS | Status: AC
  Filled 2016-06-03: qty 10

## 2016-06-03 MED ORDER — MIDAZOLAM HCL 5 MG/5ML IJ SOLN
INTRAMUSCULAR | Status: DC | PRN
  Administered 2016-06-03: 1 mg via INTRAVENOUS

## 2016-06-03 MED ORDER — CIPROFLOXACIN IN D5W 400 MG/200ML IV SOLN
400.0000 mg | INTRAVENOUS | Status: AC
  Administered 2016-06-03 (×2): 400 mg via INTRAVENOUS
  Filled 2016-06-03: qty 200

## 2016-06-03 MED ORDER — LIDOCAINE HCL (CARDIAC) 20 MG/ML IV SOLN
INTRAVENOUS | Status: DC | PRN
  Administered 2016-06-03: 40 mg via INTRAVENOUS
  Administered 2016-06-03: 60 mg via INTRAVENOUS

## 2016-06-03 MED ORDER — CIPROFLOXACIN IN D5W 400 MG/200ML IV SOLN
INTRAVENOUS | Status: AC
  Filled 2016-06-03: qty 200

## 2016-06-03 MED ORDER — DEXAMETHASONE SODIUM PHOSPHATE 10 MG/ML IJ SOLN
INTRAMUSCULAR | Status: AC
  Filled 2016-06-03: qty 1

## 2016-06-03 MED ORDER — FENTANYL CITRATE (PF) 100 MCG/2ML IJ SOLN
25.0000 ug | INTRAMUSCULAR | Status: DC | PRN
  Filled 2016-06-03: qty 1

## 2016-06-03 MED ORDER — KETOROLAC TROMETHAMINE 30 MG/ML IJ SOLN
INTRAMUSCULAR | Status: AC
  Filled 2016-06-03: qty 1

## 2016-06-03 MED ORDER — KETOROLAC TROMETHAMINE 30 MG/ML IJ SOLN
INTRAMUSCULAR | Status: DC | PRN
  Administered 2016-06-03: 15 mg via INTRAVENOUS

## 2016-06-03 MED ORDER — ONDANSETRON HCL 4 MG/2ML IJ SOLN
INTRAMUSCULAR | Status: AC
  Filled 2016-06-03: qty 2

## 2016-06-03 MED ORDER — ARTIFICIAL TEARS OP OINT
TOPICAL_OINTMENT | OPHTHALMIC | Status: AC
  Filled 2016-06-03: qty 3.5

## 2016-06-03 MED ORDER — PROPOFOL 10 MG/ML IV BOLUS
INTRAVENOUS | Status: AC
  Filled 2016-06-03: qty 40

## 2016-06-03 MED ORDER — DEXAMETHASONE SODIUM PHOSPHATE 4 MG/ML IJ SOLN
INTRAMUSCULAR | Status: DC | PRN
  Administered 2016-06-03: 10 mg via INTRAVENOUS

## 2016-06-03 MED ORDER — STERILE WATER FOR IRRIGATION IR SOLN
Status: DC | PRN
Start: 2016-06-03 — End: 2016-06-03
  Administered 2016-06-03: 3000 mL

## 2016-06-03 MED ORDER — EPHEDRINE SULFATE 50 MG/ML IJ SOLN
INTRAMUSCULAR | Status: AC
  Filled 2016-06-03: qty 1

## 2016-06-03 MED ORDER — PROPOFOL 10 MG/ML IV BOLUS
INTRAVENOUS | Status: DC | PRN
  Administered 2016-06-03: 10 mg via INTRAVENOUS
  Administered 2016-06-03: 50 mg via INTRAVENOUS
  Administered 2016-06-03: 20 mg via INTRAVENOUS
  Administered 2016-06-03: 150 mg via INTRAVENOUS

## 2016-06-03 MED ORDER — DIATRIZOATE MEGLUMINE 30 % UR SOLN
URETHRAL | Status: DC | PRN
  Administered 2016-06-03: 7 mL via URETHRAL

## 2016-06-03 MED ORDER — FENTANYL CITRATE (PF) 100 MCG/2ML IJ SOLN
INTRAMUSCULAR | Status: AC
  Filled 2016-06-03: qty 2

## 2016-06-03 SURGICAL SUPPLY — 29 items
BAG URINE DRAINAGE (UROLOGICAL SUPPLIES) ×3 IMPLANT
BLADE CLIPPER SURG (BLADE) ×3 IMPLANT
CATH FOLEY 2WAY SLVR  5CC 16FR (CATHETERS) ×2
CATH FOLEY 2WAY SLVR 5CC 16FR (CATHETERS) ×4 IMPLANT
CATH ROBINSON RED A/P 20FR (CATHETERS) ×3 IMPLANT
CLOTH BEACON ORANGE TIMEOUT ST (SAFETY) ×3 IMPLANT
COVER BACK TABLE 60X90IN (DRAPES) ×3 IMPLANT
COVER MAYO STAND STRL (DRAPES) ×3 IMPLANT
DRSG TEGADERM 4X4.75 (GAUZE/BANDAGES/DRESSINGS) ×3 IMPLANT
DRSG TEGADERM 8X12 (GAUZE/BANDAGES/DRESSINGS) ×3 IMPLANT
GLOVE BIO SURGEON STRL SZ7.5 (GLOVE) ×3 IMPLANT
GLOVE BIO SURGEON STRL SZ8 (GLOVE) ×6 IMPLANT
GLOVE BIOGEL PI IND STRL 7.5 (GLOVE) ×6 IMPLANT
GLOVE BIOGEL PI INDICATOR 7.5 (GLOVE) ×3
GLOVE ECLIPSE 8.0 STRL XLNG CF (GLOVE) ×15 IMPLANT
GOWN STRL REUS W/ TWL XL LVL3 (GOWN DISPOSABLE) ×2 IMPLANT
GOWN STRL REUS W/TWL LRG LVL3 (GOWN DISPOSABLE) ×3 IMPLANT
GOWN STRL REUS W/TWL XL LVL3 (GOWN DISPOSABLE) ×1
HOLDER FOLEY CATH W/STRAP (MISCELLANEOUS) ×3 IMPLANT
KIT ROOM TURNOVER WOR (KITS) ×3 IMPLANT
MANIFOLD NEPTUNE II (INSTRUMENTS) IMPLANT
NUCLETRON SELECTSEED 1-125 ×3 IMPLANT
PACK CYSTO (CUSTOM PROCEDURE TRAY) ×3 IMPLANT
SPONGE GAUZE 4X4 12PLY STER LF (GAUZE/BANDAGES/DRESSINGS) ×3 IMPLANT
SYRINGE 10CC LL (SYRINGE) ×3 IMPLANT
TUBE CONNECTING 12X1/4 (SUCTIONS) IMPLANT
UNDERPAD 30X30 INCONTINENT (UNDERPADS AND DIAPERS) ×6 IMPLANT
WATER STERILE IRR 3000ML UROMA (IV SOLUTION) ×3 IMPLANT
WATER STERILE IRR 500ML POUR (IV SOLUTION) ×3 IMPLANT

## 2016-06-03 NOTE — Anesthesia Postprocedure Evaluation (Signed)
Anesthesia Post Note  Patient: Stephen Dominguez  Procedure(s) Performed: Procedure(s) (LRB): RADIOACTIVE SEED IMPLANT/BRACHYTHERAPY IMPLANT (N/A) CYSTOSCOPY FLEXIBLE  Patient location during evaluation: PACU Anesthesia Type: General Level of consciousness: sedated Pain management: satisfactory to patient Vital Signs Assessment: post-procedure vital signs reviewed and stable Respiratory status: spontaneous breathing Cardiovascular status: stable Anesthetic complications: no    Last Vitals:  Filed Vitals:   06/03/16 1000 06/03/16 1015  BP: 128/74 128/75  Pulse: 65 64  Temp:    Resp: 14 16    Last Pain: There were no vitals filed for this visit.               Riccardo Dubin

## 2016-06-03 NOTE — Transfer of Care (Signed)
Last Vitals:  Filed Vitals:   06/03/16 0613  BP: 130/83  Pulse: 65  Temp: 36.7 C  Resp: 16    Last Pain: There were no vitals filed for this visit.    Patients Stated Pain Goal: 7 (06/03/16 0645) Immediate Anesthesia Transfer of Care Note  Patient: Stephen Dominguez  Procedure(s) Performed: Procedure(s) (LRB): RADIOACTIVE SEED IMPLANT/BRACHYTHERAPY IMPLANT (N/A) CYSTOSCOPY FLEXIBLE  Patient Location: PACU  Anesthesia Type: General  Level of Consciousness: awake, alert  and oriented  Airway & Oxygen Therapy: Patient Spontanous Breathing and Patient connected to face mask oxygen  Post-op Assessment: Report given to PACU RN and Post -op Vital signs reviewed and stable  Post vital signs: Reviewed and stable  Complications: No apparent anesthesia complications

## 2016-06-03 NOTE — Op Note (Signed)
PATIENT:  Alcide Goodness  PRE-OPERATIVE DIAGNOSIS:  Adenocarcinoma of the prostate  POST-OPERATIVE DIAGNOSIS:  Same  PROCEDURE:  Procedure(s): 1. I-125 radioactive seed implantation 2. Cystoscopy  SURGEON:  Surgeon(s): Claybon Jabs  Radiation oncologist: Dr. Tyler Pita  Asst.: Dr. Virginia Crews   ANESTHESIA:  General  EBL:  Minimal  DRAINS: 78 French Foley catheter  INDICATION: MARCELO MCCONNEL is a 80 year old male patient with a history of adenocarcinoma of the prostate. He had an enlarged prostate and therefore was placed on Lupron in order to resulting shrinkage of the prostate. This occurred over time and he now is brought to the operating room for radioactive seed implantation.   Description of procedure: After informed consent the patient was brought to the major OR, placed on the table and administered general anesthesia. He was then moved to the modified lithotomy position with his perineum perpendicular to the floor. His perineum and genitalia were then sterilely prepped. An official timeout was then performed. A 16 French Foley catheter was then placed in the bladder and filled with dilute contrast, a rectal tube was placed in the rectum and the transrectal ultrasound probe was placed in the rectum and affixed to the stand. He was then sterilely draped.  Real time ultrasonography was used along with the seed planning software [employer] Prostate vs. 4.2.21. This was used to develop the seed plan including the number of needles as well as number of seeds required for complete and adequate coverage. Real-time ultrasonography was then used along with the previously developed plan and the Nucletron device to implant a total of 75 seeds using 25 needles. This proceeded without difficulty or complication.  A Foley catheter was then removed as well as the transrectal ultrasound probe and rectal probe. Flexible cystoscopy was then performed using the 17 French flexible scope which  revealed a normal urethra throughout its length down to the sphincter which appeared intact. The prostatic urethra revealed bilobar hypertrophy but no evidence of obstruction, seeds, spacers or lesions. The bladder was then entered and fully and systematically inspected. The ureteral orifices were noted to be of normal configuration and position. The mucosa revealed no evidence of tumors. There were also no stones identified within the bladder. I noted no seeds or spacers on the floor of the bladder and retroflexion of the scope revealed no seeds protruding from the base of the prostate.  The cystoscope was then removed and a new 35 French Foley catheter was then inserted and the balloon was filled with 10 cc of sterile water. This was connected to closed system drainage and the patient was awakened and taken to recovery room in stable and satisfactory condition. He tolerated procedure well and there were no intraoperative complications.

## 2016-06-03 NOTE — H&P (Signed)
[name] is a 80 year old male with prostate cancer who underwent downsizing for seed implant.    He has known BPH and mild stable bladder outlet obstructive symptoms. He has taken Flomax and Avodart in the past but is on neither of these medications at this time.    Adenocarcinoma of the prostate.: In 1999 he underwent a TRUS/BX because of a PSA elevation to 5.7. That showed BPH only and I since have followed his PSA which has risen to as high as 10.6 on one occasion and in 11/10 rose to 18, but responded to a course of antibiotics. He also recalls that when his PSA had risen on another occasion antibiotics did not result in a decrease.  TRUS/BX 11/05/15: PSA - 13.05, prostate volume - 94 cc ---> after 3 month Lupron - 57 cc  Pathology: Adenocarcinoma Gleason score 3+4 = 7 in a single core from the right base laterally involving 20%.  Stage: T1c.   Interval history: 3 mo. Lupron given on 12/08/15 which resulted in downsizing of his prostate he then saw Dr. Tammi Klippel in preparation for his seed implantation and it turns out his seeds will not be implanted until 06/06/15. He told me that after his first Lupron injection he did not experience any significant hot flashes but he said for a couple of days after he had what he describes as degenerative any also noted a fairly significant increase in his appetite.     Past Medical History  Problems   1. History of Asthma (J45.909)  2. History of heartburn (Z87.898)  3. History of hypercholesterolemia (Z86.39)  4. History of Normal colonoscopy   Surgical History  Problems   1. History of Gallbladder Surgery  2. History of Needle Biopsy Of Prostate   Current Meds  1. Aspirin 81 MG TABS;  Therapy: (Recorded:11Nov2010) to Recorded  2. Cartia XT 180 MG Oral Capsule Extended Release 24 Hour;  Therapy: [id number] to Recorded  3. Losartan Potassium 100 MG Oral Tablet;  Therapy: [id number] to Recorded  4. PriLOSEC CPDR  (Omeprazole);  Therapy: (Recorded:11Nov2010) to Recorded  5. Simvastatin 40 MG Oral Tablet;  Therapy: [id number] to Recorded  6. Vitamin D (Ergocalciferol) 50000 UNIT Oral Capsule;  Therapy: (Recorded:11Nov2010) to Recorded  7. Zyrtec TABS;  Therapy: (Recorded:22Jan2010) to Recorded   Allergies  Medication   1. Darvon CAPS   Family History  Problems   1. Family history of Colon Cancer : Mother  2. Family history of Family Health Status Number Of Children   1 son; 1 daughter   Social History  Problems   1. Alcohol Use (History)   2-3/week  2. Caffeine Use   2  3. Marital History - Currently Married  4. Never A Smoker  5. Occupation:   attorney   Review of Systems constitutional, skin, eye, otolaryngeal, hematologic/lymphatic, cardiovascular, pulmonary, endocrine, musculoskeletal, gastrointestinal, neurological and psychiatric system(s) were reviewed and pertinent findings if present are noted. Genitourinary: weak urinary stream and post-void dribbling.    Vitals   Height: 5 ft 11 in  Weight: 175 lb  BMI Calculated: 24.41  BSA Calculated: 1.99  Blood Pressure: 130 / 78  Temperature: 97.7 F  Heart Rate: 57    Physical Exam  Constitutional: Well nourished and well developed . No acute distress. The patient appears well hydrated.   Abdomen: The abdomen is flat. The abdomen is soft and nontender. No suprapubic tenderness. No CVA tenderness. No hernias are palpable.   Rectal: Rectal exam demonstrates normal sphincter tone,  the anus is normal on inspection. and no tenderness. Estimated prostate size is 3+. The prostate has no nodularity. The perineum is normal on inspection, no perineal tenderness.   Genitourinary: Examination of the penis demonstrates a normal meatus. The scrotum is normal in appearance. The right testis is palpably normal, not enlarged and non-tender. The left testis is normal, not enlarged and non-tender.   Skin:  Normal skin turgor and normal skin color and pigmentation.   Neuro/Psych:. Mood and affect are appropriate.         Assessment  I discussed with him the fact that since we were somewhat delayed in placing his feeds I had recommended continued Lupron therapy which would allow the maintenance of his prostate at its current size and allow for seed implantation and in addition this would be partially protective at further progression of his disease during the time span. He has had some side effects but they seem minimal. We discussed using a 3 month preparation today and he is in agreement with this plan.    Plan We will proceed with radioactive seed implantation.

## 2016-06-03 NOTE — Anesthesia Procedure Notes (Signed)
Procedure Name: LMA Insertion Date/Time: 06/03/2016 7:57 AM Performed by: Mechele Claude Pre-anesthesia Checklist: Patient identified, Emergency Drugs available, Suction available and Patient being monitored Patient Re-evaluated:Patient Re-evaluated prior to inductionOxygen Delivery Method: Circle System Utilized Preoxygenation: Pre-oxygenation with 100% oxygen Intubation Type: IV induction Ventilation: Mask ventilation without difficulty LMA: LMA inserted LMA Size: 4.0 Number of attempts: 1 Airway Equipment and Method: bite block Placement Confirmation: positive ETCO2 Tube secured with: Tape Dental Injury: Teeth and Oropharynx as per pre-operative assessment

## 2016-06-03 NOTE — Discharge Instructions (Signed)
DISCHARGE INSTRUCTIONS FOR PROSTATE SEED IMPLANTATION  Removal of catheter Remove the foley catheter after 24 hours ( day after the procedure).can be done easily by cutting the side port of the catheter, which will allow the balloon to deflate.  You will see 1-2 teaspoons of clear water as the balloon deflates and then the catheter can be slid out without difficulty.        Cut here  Antibiotics You may be given a prescription for an antibiotic to take when you arrive home. If so, be sure to take every tablet in the bottle, even if you are feeling better before the prescription is finished. If you begin itching, notice a rash or start to swell on your trunk, arms, legs and/or throat, immediately stop taking the antibiotic and call your Urologist. Diet Resume your usual diet when you return home. To keep your bowels moving easily and softly, drink prune, apple and cranberry juice at room temperature. You may also take a stool softener, such as Colace, which is available without prescription at local pharmacies. Daily activities ? No driving or heavy lifting for at least two days after the implant. ? No bike riding, horseback riding or riding lawn mowers for the first month after the implant. ? Any strenuous physical activity should be approved by your doctor before you resume it. Sexual relations You may resume sexual relations two weeks after the procedure. A condom should be used for the first two weeks. Your semen may be dark brown or black; this is normal and is related bleeding that may have occurred during the implant. Postoperative swelling Expect swelling and bruising of the scrotum and perineum (the area between the scrotum and anus). Both the swelling and the bruising should resolve in l or 2 weeks. Ice packs and over- the-counter medications such as Tylenol, Advil or Aleve may lessen your discomfort. Postoperative urination Most men experience burning on urination and/or urinary  frequency. If this becomes bothersome, contact your Urologist.  Medication can be prescribed to relieve these problems.  It is normal to have some blood in your urine for a few days after the implant. Special instructions related to the seeds It is unlikely that you will pass an Iodine-125 seed in your urine. The seeds are silver in color and are about as large as a grain of rice. If you pass a seed, do not handle it with your fingers. Use a spoon to place it in an envelope or jar in place this in base occluded area such as the garage or basement for return to the radiation clinic at your convenience.  Contact your doctor for ? Temperature greater than 101 F ? Increasing pain ? Inability to urinate Follow-up  You should have follow up with your urologist and radiation oncologist about 3 weeks after the procedure. General information regarding Iodine seeds ? Iodine-125 is a low energy radioactive material. It is not deeply penetrating and loses energy at short distances. Your prostate will absorb the radiation. Objects that are touched or used by the patient do not become radioactive. ? Body wastes (urine and stool) or body fluids (saliva, tears, semen or blood) are not radioactive. ? The Nuclear Regulatory Commission [facility]) has determined that no radiation precautions are needed for patients undergoing Iodine-125 seed implantation. The [facility] states that such patients do not present a risk to the people around them, including young children and pregnant women. However, in keeping with the general principle that radiation exposure should be kept as  low reasonably possible, we suggest the following: ? Children and pets should not sit on the patient's lap for the first two (2) weeks after the implant. ? Pregnant (or possibly pregnant) women should avoid prolonged, close contact with the patient for the first two (2) weeks after the implant. ? A distance of three (3) feet is acceptable. ? At a distance of  three (3) feet, there is no limit to the length of time anyone can be with the patient.  Post Anesthesia Home Care Instructions  Activity: Get plenty of rest for the remainder of the day. A responsible adult should stay with you for 24 hours following the procedure.  For the next 24 hours, DO NOT: -Drive a car -Paediatric nurse -Drink alcoholic beverages -Take any medication unless instructed by your physician -Make any legal decisions or sign important papers.  Meals: Start with liquid foods such as gelatin or soup. Progress to regular foods as tolerated. Avoid greasy, spicy, heavy foods. If nausea and/or vomiting occur, drink only clear liquids until the nausea and/or vomiting subsides. Call your physician if vomiting continues.  Special Instructions/Symptoms: Your throat may feel dry or sore from the anesthesia or the breathing tube placed in your throat during surgery. If this causes discomfort, gargle with warm salt water. The discomfort should disappear within 24 hours.  If you had a scopolamine patch placed behind your ear for the management of post- operative nausea and/or vomiting:  1. The medication in the patch is effective for 72 hours, after which it should be removed.  Wrap patch in a tissue and discard in the trash. Wash hands thoroughly with soap and water. 2. You may remove the patch earlier than 72 hours if you experience unpleasant side effects which may include dry mouth, dizziness or visual disturbances. 3. Avoid touching the patch. Wash your hands with soap and water after contact with the patch.

## 2016-06-04 ENCOUNTER — Encounter (HOSPITAL_BASED_OUTPATIENT_CLINIC_OR_DEPARTMENT_OTHER): Payer: Self-pay | Admitting: Urology

## 2016-06-04 NOTE — Progress Notes (Signed)
  Radiation Oncology         [phone] ________________________________  Name: Stephen Dominguez MRN: [id number]  Date: 06/04/2016  DOB: 1935/12/23       Prostate Seed Implant  [id number], MD  No ref. provider found  DIAGNOSIS:  80 y.o. gentleman with stage T1c adenocarcinoma of the prostate with a Gleason's score of 3+4 and a PSA of 13.05    ICD-9-CM ICD-10-CM   1. Prostate cancer ([place]) 185 C61 DG Chest 2 View     DG Chest 2 View     Urinary leg bag     Discharge patient    PROCEDURE: Insertion of radioactive I-125 seeds into the prostate gland.  RADIATION DOSE: 145 Gy, definitive therapy.  TECHNIQUE: Stephen Dominguez was brought to the operating room with the urologist. He was placed in the dorsolithotomy position. He was catheterized and a rectal tube was inserted. The perineum was shaved, prepped and draped. The ultrasound probe was then introduced into the rectum to see the prostate gland.  TREATMENT DEVICE: A needle grid was attached to the ultrasound probe stand and anchor needles were placed.  3D PLANNING: The prostate was imaged in 3D using a sagittal sweep of the prostate probe. These images were transferred to the planning computer. There, the prostate, urethra and rectum were defined on each axial reconstructed image. Then, the software created an optimized 3D plan and a few seed positions were adjusted. The quality of the plan was reviewed using [facility] information for the target and the following two organs at risk:  Urethra and Rectum.  Then the accepted plan was uploaded to the seed Selectron afterloading unit.  PROSTATE VOLUME STUDY:  Using transrectal ultrasound the volume of the prostate was verified to be 69.34 cc.  SPECIAL TREATMENT PROCEDURE/SUPERVISION AND HANDLING: The Nucletron FIRST system was used to place the needles under sagittal guidance. A total of 25 needles were used to deposit 75 seeds in the prostate gland. The individual seed activity was 0.644  mCi.  COMPLEX SIMULATION: At the end of the procedure, an anterior radiograph of the pelvis was obtained to document seed positioning and count. Cystoscopy was performed to check the urethra and bladder.  MICRODOSIMETRY: At the end of the procedure, the patient was emitting 0.44 mR/hr at 1 meter. Accordingly, he was considered safe for hospital discharge.  PLAN: The patient will return to the radiation oncology clinic for post implant CT dosimetry in three weeks.   ________________________________  Sheral Apley Tammi Klippel, M.D.

## 2016-06-24 DIAGNOSIS — C61 Malignant neoplasm of prostate: Secondary | ICD-10-CM | POA: Diagnosis not present

## 2016-06-30 ENCOUNTER — Telehealth: Payer: Self-pay | Admitting: *Deleted

## 2016-06-30 NOTE — Telephone Encounter (Signed)
Called patient to remind of post seed appts. For 07-01-16, spoke with patient and he is aware of these appts.

## 2016-07-01 ENCOUNTER — Ambulatory Visit
Admit: 2016-07-01 | Discharge: 2016-07-01 | Disposition: A | Discharge status: Home / Self Care | Payer: Medicare Other | Attending: Radiation Oncology | Admitting: Radiation Oncology

## 2016-07-01 ENCOUNTER — Encounter: Payer: Self-pay | Admitting: Radiation Oncology

## 2016-07-01 VITALS — BP 114/67 | HR 56 | Resp 16 | Wt 183.3 lb

## 2016-07-01 DIAGNOSIS — C61 Malignant neoplasm of prostate: Secondary | ICD-10-CM | POA: Insufficient documentation

## 2016-07-01 DIAGNOSIS — Z510 Encounter for antineoplastic radiation therapy: Secondary | ICD-10-CM | POA: Diagnosis not present

## 2016-07-01 NOTE — Progress Notes (Signed)
  Radiation Oncology         [phone] ________________________________  Name: Stephen Dominguez MRN: [id number]  Date: 07/01/2016  DOB: 1936-09-23  COMPLEX SIMULATION NOTE  NARRATIVE:  The patient was brought to the [facility] today following prostate seed implantation approximately one month ago.  Identity was confirmed.  All relevant records and images related to the planned course of therapy were reviewed.  Then, the patient was set-up supine.  CT images were obtained.  The CT images were loaded into the planning software.  Then the prostate and rectum were contoured.  Treatment planning then occurred.  The implanted iodine 125 seeds were identified by the physics staff for projection of radiation distribution  I have requested : 3D Simulation  I have requested a DVH of the following structures: Prostate and rectum.    ________________________________  Sheral Apley Tammi Klippel, M.D.  This document serves as a record of services personally performed by Tyler Pita, MD. It was created on his behalf by Arlyce Harman, a trained medical scribe. The creation of this record is based on the scribe's personal observations and the provider's statements to them. This document has been checked and approved by the attending provider.

## 2016-07-01 NOTE — Progress Notes (Signed)
Returns today for post seed visit with Dr. Tammi Klippel. Weight and vitals stable. Denies pain. Pre seed IPSS 2 but, post seed IPSS is 11. Reports intermittency and weak stream are his only real complaints. Denies urgency or frequency. Denies difficulty emptying his bladder. Denies dysuria or hematuria. Denies leakage or incontinence. Denies ugency. Scheduled to follow up with Dr. Karsten Ro and have his PSA checked in three months.   BP 114/67 (BP Location: Right Arm, Patient Position: Sitting, Cuff Size: Normal)   Pulse (!) 56   Resp 16   Wt 183 lb 4.8 oz (83.1 kg)   SpO2 100%   BMI 25.93 kg/m  Wt Readings from Last 3 Encounters:  07/01/16 183 lb 4.8 oz (83.1 kg)  06/03/16 179 lb (81.2 kg)  03/23/16 183 lb 1.6 oz (83.1 kg)

## 2016-07-01 NOTE — Addendum Note (Signed)
Encounter addended by: Heywood Footman, RN on: 07/01/2016  3:29 PM<BR>    Actions taken: Charge Capture section accepted

## 2016-07-01 NOTE — Progress Notes (Signed)
Radiation Oncology         [phone]) [phone] ________________________________  Name: Stephen Dominguez MRN: [id number]  Date: 07/01/2016  DOB: Nov 13, 1936  Follow-Up Visit Note  CC: Donnajean Lopes, MD  Kathie Rhodes, MD  Diagnosis: 80 y.o. year-old gentleman with intermediate risk T1c adenocarcinoma of the prostate with Gleason's score of 3+4 and PSA Of 13.05    ICD-9-CM ICD-10-CM   1. Malignant neoplasm of prostate ([facility]) 185 C61     Interval Since Last Radiation:  3.5 weeks  06/03/2016: radioactive seed implant 75 radioactive seeds placed, to a total of 145 Gy  Narrative:  The patient returns today for routine follow-up. He was taken to the operating room on 06/03/2016 revealing underwent radioactive seed implant. He is complaining of increased urinary frequency and urinary hesitation symptoms. He filled out a questionnaire regarding urinary function today providing and overall IPSS score of 11 characterizing his symptoms as moderate.  His pre-implant IPSS score was 2. He denies any bowel symptoms. His main complaint is intermittent the, and is able to evacuate his bladder completely. He denies any diarrhea or bowel disturbances. He is having nocturia 2. Scheduled to follow up with Dr. Karsten Ro and have his PSA checked in 3 months.   ALLERGIES:  is allergic to darvon [propoxyphene].  Meds: Current Outpatient Prescriptions  Medication Sig Dispense Refill  . aspirin 81 MG tablet Take 81 mg by mouth daily.    . cetirizine (ZYRTEC) 10 MG tablet Take 10 mg by mouth every morning.     . Cholecalciferol (VITAMIN D3) 50000 units CAPS Take 1 capsule by mouth every 30 (thirty) days. First of the month    . diltiazem (CARDIZEM CD) 180 MG 24 hr capsule Take 180 mg by mouth every morning.     Marland Kitchen HYDROcodone-acetaminophen (NORCO) 7.5-325 MG tablet Take 1-2 tablets by mouth every 4 (four) hours as needed for moderate pain. Maximum dose per 24 hours - 8 pills 20 tablet 0  . ibuprofen (ADVIL,MOTRIN) 200 MG  tablet Take 200 mg by mouth every 6 (six) hours as needed.    Marland Kitchen losartan (COZAAR) 100 MG tablet Take 100 mg by mouth every morning.     Marland Kitchen omeprazole (PRILOSEC) 20 MG capsule Take 20 mg by mouth every morning.    . simvastatin (ZOCOR) 40 MG tablet Take 40 mg by mouth every morning.      No current facility-administered medications for this encounter.     Physical Findings:  weight is 183 lb 4.8 oz (83.1 kg). His blood pressure is 114/67 and his pulse is 56 (abnormal). His respiration is 16 and oxygen saturation is 100%.   In general this is a well appearing Caucasian male in no acute distress. He's alert and oriented x4 and appropriate throughout the examination. Cardiopulmonary assessment is negative for acute distress and he exhibits normal effort.   Lab Findings: Lab Results  Component Value Date   WBC 5.9 05/27/2016   HGB 12.5 (L) 05/27/2016   HCT 38.0 (L) 05/27/2016   MCV 94.3 05/27/2016   PLT 320 05/27/2016    Radiographic Findings:  Patient underwent CT imaging in our clinic for post implant dosimetry. The CT appears to demonstrate an adequate distribution of radioactive seeds throughout the prostate gland. There no seeds in her near the rectum. Dr. Tammi Klippel suspects the final radiation plan and dosimetry will show appropriate coverage of the prostate gland.   Impression: The patient is recovering from the effects of radiation. His urinary symptoms should gradually  improve over the next 4-6 months. We talked about this today. He is encouraged by his improvement already and is otherwise please with his outcome.   Plan: Today, We spent time talking to the patient about his prostate seed implant and resolving urinary symptoms. We also talked about long-term follow-up for prostate cancer following seed implant. He understands that ongoing PSA determinations and digital rectal exams will help perform surveillance to rule out disease recurrence. He understands what to expect with his PSA  measures. Patient was also educated today about some of the long-term effects from radiation including a small risk for rectal bleeding and possibly erectile dysfunction. We talked about some of the general management approaches to these potential complications. However, we did encourage the patient to contact our office or return at any point if he has questions or concerns related to his previous radiation and prostate cancer.     Carola Rhine, PAC Seen within dictated for Sheral Apley. Tammi Klippel, MD    This document serves as a record of services personally performed by Tyler Pita, MD. It was created on his behalf by Arlyce Harman, a trained medical scribe. The creation of this record is based on the scribe's personal observations and the provider's statements to them. This document has been checked and approved by the attending provider.

## 2016-07-14 DIAGNOSIS — Z510 Encounter for antineoplastic radiation therapy: Secondary | ICD-10-CM | POA: Diagnosis not present

## 2016-07-19 NOTE — Progress Notes (Signed)
  Radiation Oncology         [phone] ________________________________  Name: Stephen Dominguez MRN: [id number]  Date: 07/01/2016  DOB: [birth year]  3D Planning Note   Prostate Brachytherapy Post-Implant Dosimetry  Diagnosis: 80 yo male with adenocarcinoma of the prostate with a Gleason score of 3+4 and PSA of 13.05  Narrative: On a previous date, Stephen Dominguez returned following prostate seed implantation for post implant planning. He underwent CT scan complex simulation to delineate the three-dimensional structures of the pelvis and demonstrate the radiation distribution.  Since that time, the seed localization, and complex isodose planning with dose volume histograms have now been completed.  Results:   Prostate Coverage - The dose of radiation delivered to the 90% or more of the prostate gland (D90) was 107.03% of the prescription dose. This exceeds our goal of greater than 90%. Rectal Sparing - The volume of rectal tissue receiving the prescription dose or higher was 0.0 cc. This falls under our thresholds tolerance of 1.0 cc.  Impression: The prostate seed implant appears to show adequate target coverage and appropriate rectal sparing.  Plan:  The patient will continue to follow with urology for ongoing PSA determinations. I would anticipate a high likelihood for local tumor control with minimal risk for rectal morbidity.  ________________________________  Sheral Apley Tammi Klippel, M.D.

## 2016-07-31 DIAGNOSIS — Z23 Encounter for immunization: Secondary | ICD-10-CM | POA: Diagnosis not present

## 2016-08-11 DIAGNOSIS — M5416 Radiculopathy, lumbar region: Secondary | ICD-10-CM | POA: Diagnosis not present

## 2016-08-11 DIAGNOSIS — Z6826 Body mass index (BMI) 26.0-26.9, adult: Secondary | ICD-10-CM | POA: Diagnosis not present

## 2016-08-27 DIAGNOSIS — H35373 Puckering of macula, bilateral: Secondary | ICD-10-CM | POA: Diagnosis not present

## 2016-08-27 DIAGNOSIS — H35033 Hypertensive retinopathy, bilateral: Secondary | ICD-10-CM | POA: Diagnosis not present

## 2016-08-27 DIAGNOSIS — Z961 Presence of intraocular lens: Secondary | ICD-10-CM | POA: Diagnosis not present

## 2016-09-24 DIAGNOSIS — C61 Malignant neoplasm of prostate: Secondary | ICD-10-CM | POA: Diagnosis not present

## 2016-09-29 DIAGNOSIS — Z8546 Personal history of malignant neoplasm of prostate: Secondary | ICD-10-CM | POA: Diagnosis not present

## 2016-12-23 IMAGING — CR DG CHEST 2V
2 series · 2 of 2 positions shown · non-contrast
Comparison: PA and lateral chest x-ray March 12, 2008

CLINICAL DATA: Preoperative examination prior prostate seed implant
placement; no cardiopulmonary history.

EXAM:
CHEST  2 VIEW

[w chest pa]
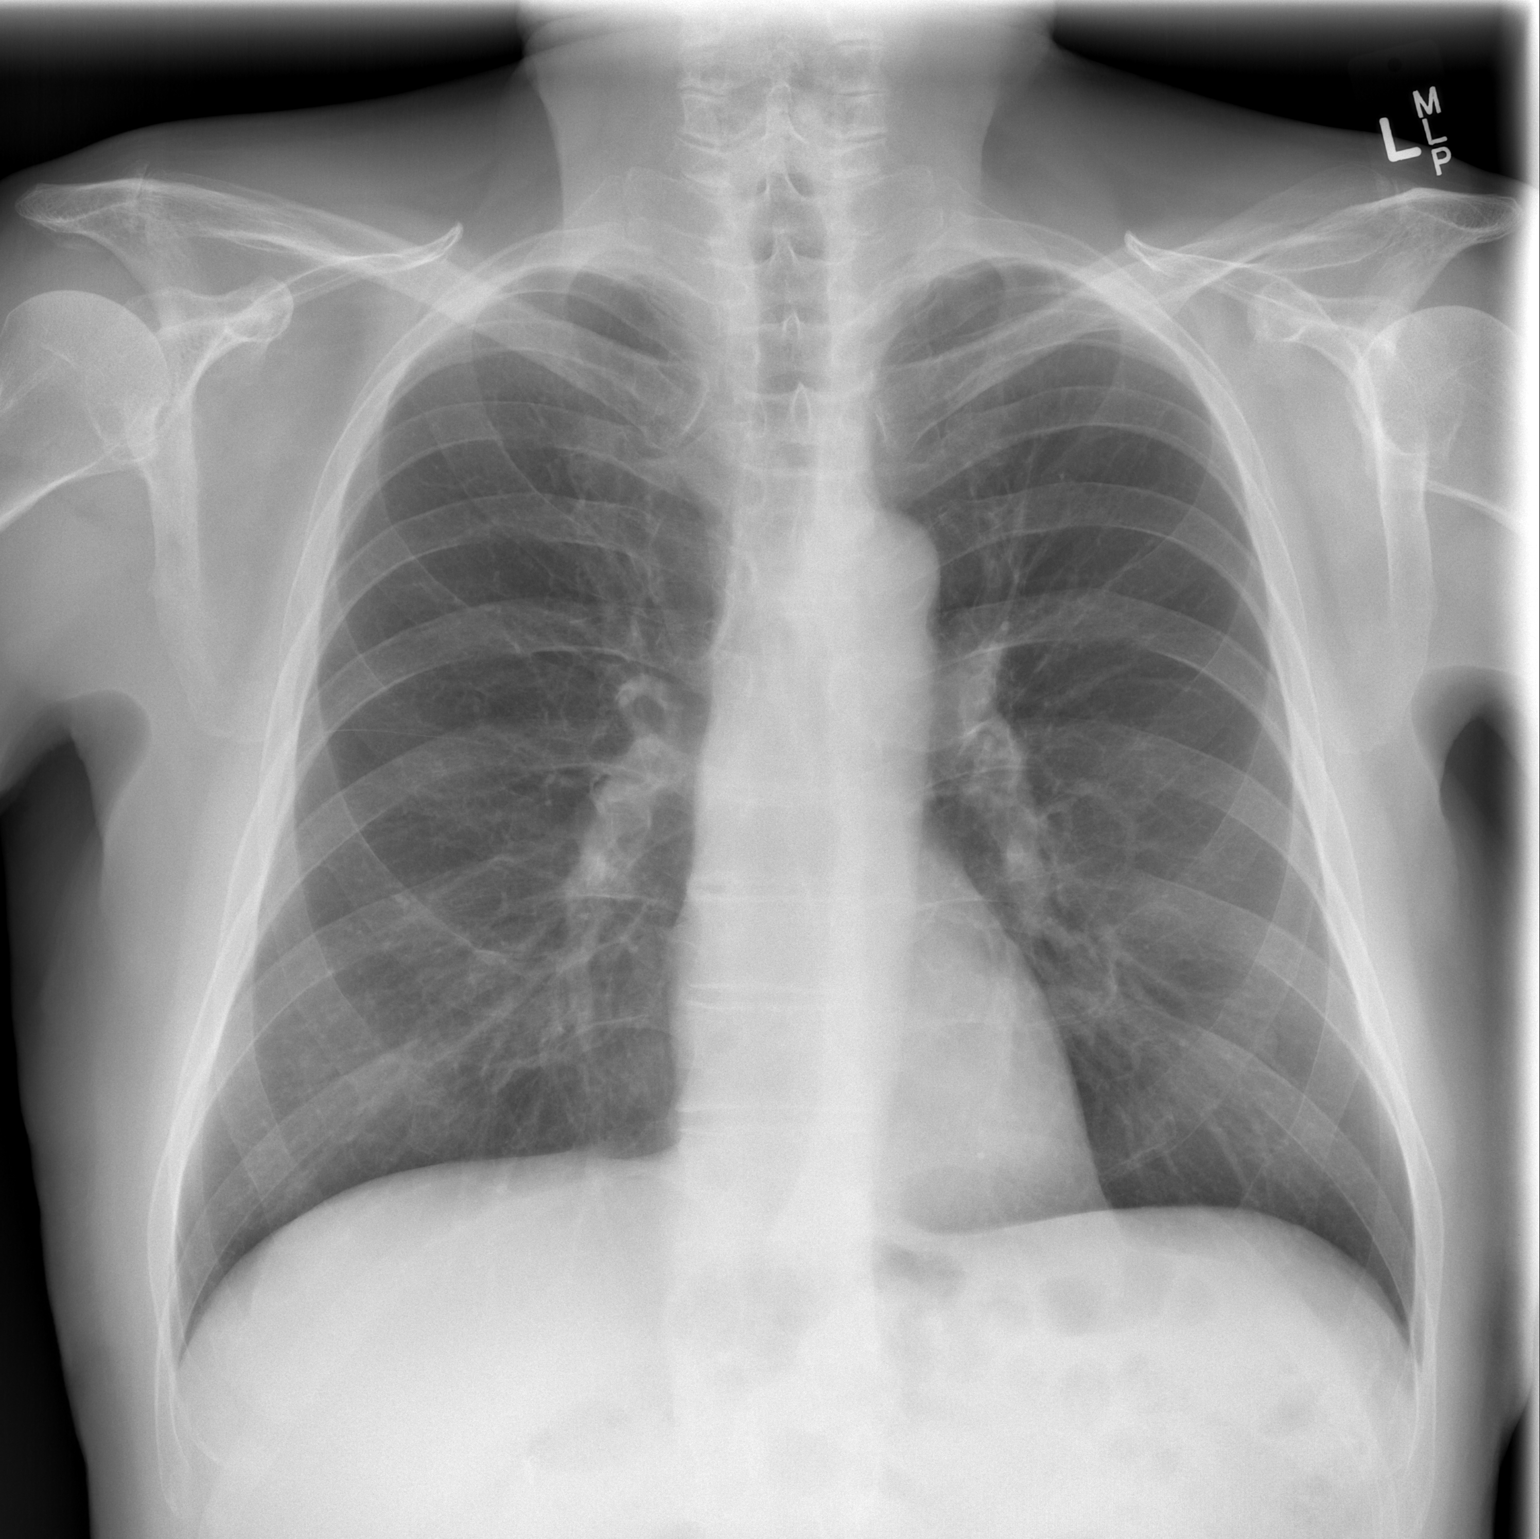

[w chest lat]
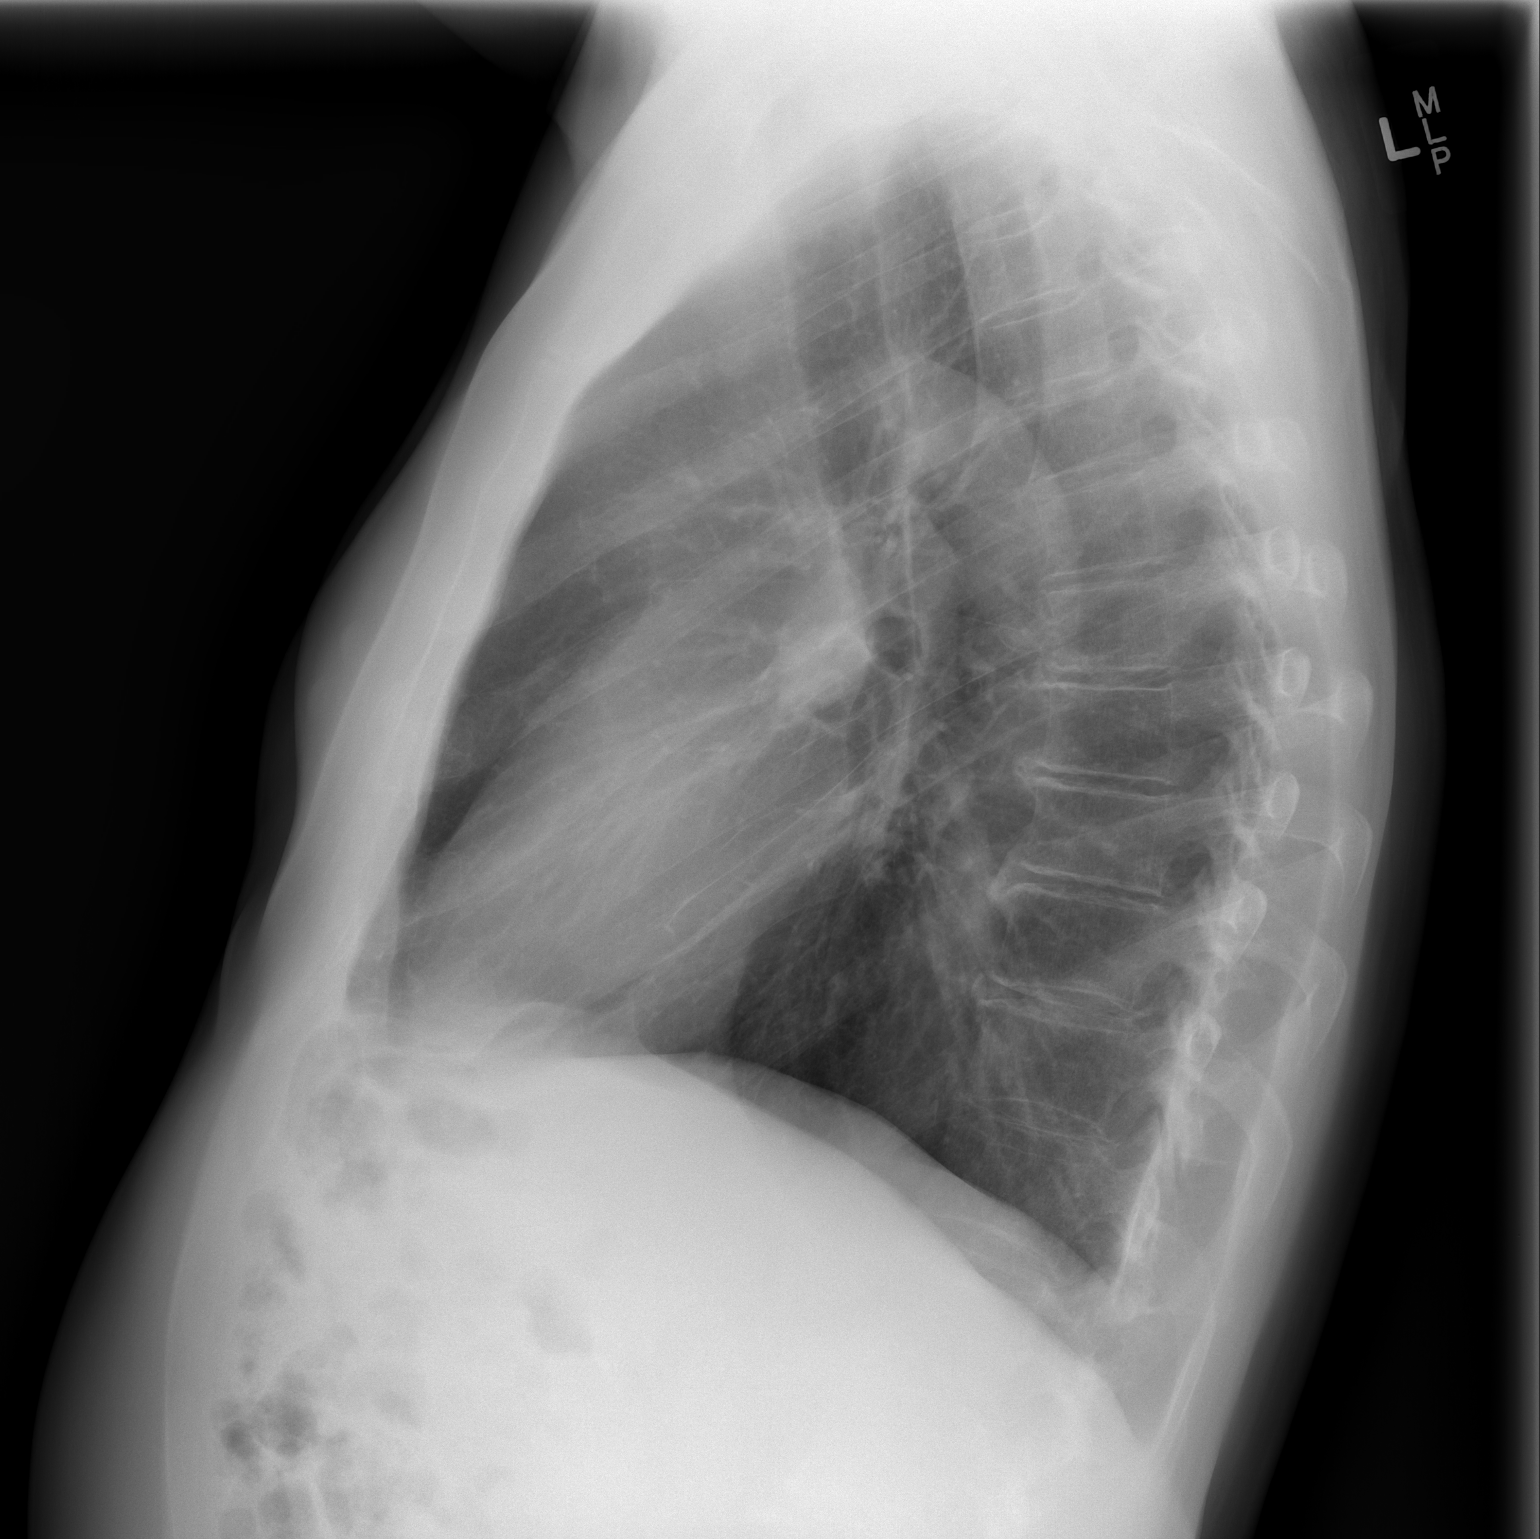

[2 of 2 positions shown; findings below may reference images not displayed]

FINDINGS: The lungs are well-expanded. There is no focal infiltrate. There are
no pulmonary parenchymal nodules. There is no pleural effusion. The
heart and pulmonary vascularity are normal. The mediastinum is
normal in width. The bony thorax is unremarkable.
IMPRESSION: There is no active cardiopulmonary disease.

## 2017-01-18 DIAGNOSIS — Z85828 Personal history of other malignant neoplasm of skin: Secondary | ICD-10-CM | POA: Diagnosis not present

## 2017-01-18 DIAGNOSIS — D1801 Hemangioma of skin and subcutaneous tissue: Secondary | ICD-10-CM | POA: Diagnosis not present

## 2017-01-18 DIAGNOSIS — L821 Other seborrheic keratosis: Secondary | ICD-10-CM | POA: Diagnosis not present

## 2017-01-18 DIAGNOSIS — L814 Other melanin hyperpigmentation: Secondary | ICD-10-CM | POA: Diagnosis not present

## 2017-02-03 DIAGNOSIS — E559 Vitamin D deficiency, unspecified: Secondary | ICD-10-CM | POA: Diagnosis not present

## 2017-02-03 DIAGNOSIS — Z125 Encounter for screening for malignant neoplasm of prostate: Secondary | ICD-10-CM | POA: Diagnosis not present

## 2017-02-03 DIAGNOSIS — N183 Chronic kidney disease, stage 3 (moderate): Secondary | ICD-10-CM | POA: Diagnosis not present

## 2017-02-03 DIAGNOSIS — E784 Other hyperlipidemia: Secondary | ICD-10-CM | POA: Diagnosis not present

## 2017-02-15 DIAGNOSIS — M545 Low back pain: Secondary | ICD-10-CM | POA: Diagnosis not present

## 2017-02-15 DIAGNOSIS — Z0000 Encounter for general adult medical examination without abnormal findings: Secondary | ICD-10-CM | POA: Diagnosis not present

## 2017-02-15 DIAGNOSIS — R05 Cough: Secondary | ICD-10-CM | POA: Diagnosis not present

## 2017-02-15 DIAGNOSIS — C61 Malignant neoplasm of prostate: Secondary | ICD-10-CM | POA: Diagnosis not present

## 2017-03-23 DIAGNOSIS — C61 Malignant neoplasm of prostate: Secondary | ICD-10-CM | POA: Diagnosis not present

## 2017-03-30 DIAGNOSIS — Z8546 Personal history of malignant neoplasm of prostate: Secondary | ICD-10-CM | POA: Diagnosis not present

## 2017-05-25 DIAGNOSIS — L821 Other seborrheic keratosis: Secondary | ICD-10-CM | POA: Diagnosis not present

## 2017-08-13 DIAGNOSIS — Z23 Encounter for immunization: Secondary | ICD-10-CM | POA: Diagnosis not present

## 2017-09-02 DIAGNOSIS — H35033 Hypertensive retinopathy, bilateral: Secondary | ICD-10-CM | POA: Diagnosis not present

## 2017-09-02 DIAGNOSIS — Z961 Presence of intraocular lens: Secondary | ICD-10-CM | POA: Diagnosis not present

## 2017-09-02 DIAGNOSIS — H35373 Puckering of macula, bilateral: Secondary | ICD-10-CM | POA: Diagnosis not present

## 2017-09-02 DIAGNOSIS — H43813 Vitreous degeneration, bilateral: Secondary | ICD-10-CM | POA: Diagnosis not present

## 2017-09-28 DIAGNOSIS — C600 Malignant neoplasm of prepuce: Secondary | ICD-10-CM | POA: Diagnosis not present

## 2017-10-04 DIAGNOSIS — Z8546 Personal history of malignant neoplasm of prostate: Secondary | ICD-10-CM | POA: Diagnosis not present

## 2017-10-04 DIAGNOSIS — R300 Dysuria: Secondary | ICD-10-CM | POA: Diagnosis not present

## 2017-11-19 ENCOUNTER — Other Ambulatory Visit: Payer: Self-pay

## 2017-11-19 ENCOUNTER — Emergency Department (HOSPITAL_COMMUNITY)
Admission: EM | Admit: 2017-11-19 | Discharge: 2017-11-19 | Disposition: A | Discharge status: Home / Self Care | Payer: Medicare Other | Attending: Emergency Medicine | Admitting: Emergency Medicine

## 2017-11-19 ENCOUNTER — Encounter (HOSPITAL_COMMUNITY): Payer: Self-pay | Admitting: *Deleted

## 2017-11-19 DIAGNOSIS — Z7982 Long term (current) use of aspirin: Secondary | ICD-10-CM | POA: Diagnosis not present

## 2017-11-19 DIAGNOSIS — Z79899 Other long term (current) drug therapy: Secondary | ICD-10-CM | POA: Insufficient documentation

## 2017-11-19 DIAGNOSIS — R1030 Lower abdominal pain, unspecified: Secondary | ICD-10-CM | POA: Diagnosis not present

## 2017-11-19 DIAGNOSIS — I10 Essential (primary) hypertension: Secondary | ICD-10-CM | POA: Insufficient documentation

## 2017-11-19 DIAGNOSIS — R112 Nausea with vomiting, unspecified: Secondary | ICD-10-CM | POA: Insufficient documentation

## 2017-11-19 DIAGNOSIS — R197 Diarrhea, unspecified: Secondary | ICD-10-CM | POA: Diagnosis not present

## 2017-11-19 DIAGNOSIS — R109 Unspecified abdominal pain: Secondary | ICD-10-CM | POA: Diagnosis present

## 2017-11-19 LAB — COMPREHENSIVE METABOLIC PANEL
ALK PHOS: 87 U/L (ref 38–126)
ALT: 14 U/L — ABNORMAL LOW (ref 17–63)
ANION GAP: 13 (ref 5–15)
AST: 21 U/L (ref 15–41)
Albumin: 4.2 g/dL (ref 3.5–5.0)
BUN: 19 mg/dL (ref 6–20)
CALCIUM: 9.5 mg/dL (ref 8.9–10.3)
CO2: 20 mmol/L — ABNORMAL LOW (ref 22–32)
Chloride: 107 mmol/L (ref 101–111)
Creatinine, Ser: 1.54 mg/dL — ABNORMAL HIGH (ref 0.61–1.24)
GFR calc non Af Amer: 41 mL/min — ABNORMAL LOW (ref >60)
GFR, EST AFRICAN AMERICAN: 48 mL/min — AB (ref >60)
Glucose, Bld: 121 mg/dL — ABNORMAL HIGH (ref 65–99)
Potassium: 4 mmol/L (ref 3.5–5.1)
SODIUM: 140 mmol/L (ref 135–145)
TOTAL PROTEIN: 6.9 g/dL (ref 6.5–8.1)
Total Bilirubin: 1.3 mg/dL — ABNORMAL HIGH (ref 0.3–1.2)

## 2017-11-19 LAB — CBC
HCT: 44.2 % (ref 39.0–52.0)
HEMOGLOBIN: 14.6 g/dL (ref 13.0–17.0)
MCH: 31.3 pg (ref 26.0–34.0)
MCHC: 33 g/dL (ref 30.0–36.0)
MCV: 94.6 fL (ref 78.0–100.0)
Platelets: 234 10*3/uL (ref 150–400)
RBC: 4.67 MIL/uL (ref 4.22–5.81)
RDW: 12.8 % (ref 11.5–15.5)
WBC: 10.3 10*3/uL (ref 4.0–10.5)

## 2017-11-19 LAB — LIPASE, BLOOD: Lipase: 35 U/L (ref 11–51)

## 2017-11-19 MED ORDER — SODIUM CHLORIDE 0.9 % IV BOLUS (SEPSIS)
1000.0000 mL | Freq: Once | INTRAVENOUS | Status: AC
  Administered 2017-11-19: 1000 mL via INTRAVENOUS

## 2017-11-19 MED ORDER — ONDANSETRON 4 MG PO TBDP: 4.0000 mg | ORAL_TABLET | Freq: Three times a day (TID) | ORAL | 0 refills | Status: AC | PRN

## 2017-11-19 MED ORDER — METOCLOPRAMIDE HCL 5 MG/ML IJ SOLN
10.0000 mg | Freq: Once | INTRAMUSCULAR | Status: AC
  Administered 2017-11-19: 10 mg via INTRAVENOUS
  Filled 2017-11-19: qty 2

## 2017-11-19 MED ORDER — ONDANSETRON 4 MG PO TBDP
4.0000 mg | ORAL_TABLET | Freq: Once | ORAL | Status: AC
  Administered 2017-11-19: 4 mg via ORAL
  Filled 2017-11-19: qty 1

## 2017-11-19 MED ORDER — ONDANSETRON 4 MG PO TBDP
4.0000 mg | ORAL_TABLET | Freq: Once | ORAL | Status: AC | PRN
  Administered 2017-11-19: 4 mg via ORAL
  Filled 2017-11-19: qty 1

## 2017-11-19 NOTE — ED Notes (Signed)
Family at bedside. 

## 2017-11-19 NOTE — ED Notes (Signed)
Pt. Stated, The medicine helped before but I can feel it coming back.

## 2017-11-19 NOTE — ED Provider Notes (Signed)
[facility] EMERGENCY DEPARTMENT Provider Note   CSN: [id number] Arrival date & time: 11/19/17  1055     History   Chief Complaint Chief Complaint  Patient presents with  . Abdominal Pain  . Emesis  . Diarrhea    HPI Stephen Dominguez is a 82 y.o. male.  67yo M w/ PMH including prostate CA, HTN, HLD who p/w nausea, vomiting, and diarrhea. He reports 2 days of  non-bloody diarrhea with lower abdominal cramping. He continues to have urgency to go to bathroom but no more diarrhea. This morning he began feeling nauseated and took pepto bismol and gingerale. He has vomited 4-5 times today. He feels dehydrated and jittery. He took ibuprofen this morning before walking. He was given zofran in triage which helped with nausea and abdominal cramping but the nausea has returned. When he did have abdominal pain it was crampy and lower along with tenesmus. No urinary symptoms, fevers, cough/cold sx, sick contacts, recent antibiotic use, or recent travel.    The history is provided by the patient.  Abdominal Pain   Associated symptoms include diarrhea and vomiting.  Emesis   Associated symptoms include abdominal pain and diarrhea.  Diarrhea   Associated symptoms include abdominal pain and vomiting.    Past Medical History:  Diagnosis Date  . BPH (benign prostatic hyperplasia)   . GERD (gastroesophageal reflux disease)   . Hyperlipidemia   . Hypertension   . Nocturia   . Prostate cancer [facility]) urologist-  dr ottelin/  oncologist-  dr Tammi Klippel   T1c,  Gleason 3+4,  PSA 13.5,  vol 94cc    Patient Active Problem List   Diagnosis Date Noted  . Malignant neoplasm of prostate ([place]) 12/03/2015    Past Surgical History:  Procedure Laterality Date  . CATARACT EXTRACTION W/ INTRAOCULAR LENS  IMPLANT, BILATERAL  2012  approx  . COLONOSCOPY  last one 2015  . CYSTOSCOPY  06/03/2016   Procedure: CYSTOSCOPY FLEXIBLE;  Surgeon: Kathie Rhodes, MD;  Location: [facility];   Service: Urology;;  NO SEEDS FOUND IN BLADDER  . INGUINAL HERNIA REPAIR Right 1990's  . LAPAROSCOPIC CHOLECYSTECTOMY  1997  . PROSTATE BIOPSY  1999  . PROSTATE BIOPSY  11/05/15  . RADIOACTIVE SEED IMPLANT N/A 06/03/2016   Procedure: RADIOACTIVE SEED IMPLANT/BRACHYTHERAPY IMPLANT;  Surgeon: Kathie Rhodes, MD;  Location: [facility];  Service: Urology;  Laterality: N/A;   77  SEEDS IMPLANTED  . TENDON REPAIR LEFT ELBOW  1990's  . TONSILLECTOMY  age 4       Home Medications    Prior to Admission medications   Medication Sig Start Date End Date Taking? Authorizing Provider  aspirin 81 MG tablet Take 81 mg by mouth daily.   Yes [provider]  cetirizine (ZYRTEC) 10 MG tablet Take 10 mg by mouth every morning.    Yes [provider]  Cholecalciferol (VITAMIN D3) 50000 units CAPS Take 1 capsule by mouth every 30 (thirty) days. First of the month   Yes [provider]  diltiazem (CARDIZEM CD) 180 MG 24 hr capsule Take 180 mg by mouth every morning.    Yes [provider]  ibuprofen (ADVIL,MOTRIN) 200 MG tablet Take 200 mg by mouth every 6 (six) hours as needed for moderate pain.    Yes [provider]  losartan (COZAAR) 100 MG tablet Take 100 mg by mouth every morning.    Yes [provider]  omeprazole (PRILOSEC) 20 MG capsule  Take 20 mg by mouth every morning.   Yes [provider]  simvastatin (ZOCOR) 40 MG tablet Take 40 mg by mouth every morning.    Yes [provider]  HYDROcodone-acetaminophen (NORCO) 7.5-325 MG tablet Take 1-2 tablets by mouth every 4 (four) hours as needed for moderate pain. Maximum dose per 24 hours - 8 pills Patient not taking: Reported on 11/19/2017 06/03/16   Kathie Rhodes, MD  ondansetron (ZOFRAN ODT) 4 MG disintegrating tablet Take 1 tablet (4 mg total) by mouth every 8 (eight) hours as needed for nausea or vomiting. 11/19/17   Senon Nixon, Wenda Overland, MD    Family History Family  History  Problem Relation Age of Onset  . Cancer Mother        breast and colon    Social History Social History   Tobacco Use  . Smoking status: Never Smoker  . Smokeless tobacco: Never Used  Substance Use Topics  . Alcohol use: Yes    Comment: occasional  . Drug use: No     Allergies   Darvon [propoxyphene]   Review of Systems Review of Systems  Gastrointestinal: Positive for abdominal pain, diarrhea and vomiting.   All other systems reviewed and are negative except that which was mentioned in HPI   Physical Exam Updated Vital Signs BP (!) 159/72   Pulse (!) 52   Temp 97.9 F (36.6 C) (Oral)   Resp 20   SpO2 100%   Physical Exam  Constitutional: He is oriented to person, place, and time. He appears well-developed and well-nourished. No distress.  HENT:  Head: Normocephalic and atraumatic.  dry mucous membranes  Eyes: Conjunctivae are normal.  Neck: Neck supple.  Cardiovascular: Normal rate, regular rhythm and normal heart sounds.  No murmur heard. Pulmonary/Chest: Effort normal and breath sounds normal.  Abdominal: Soft. Bowel sounds are normal. He exhibits no distension. There is no tenderness.  Musculoskeletal: He exhibits no edema.  Neurological: He is alert and oriented to person, place, and time.  Fluent speech  Skin: Skin is warm and dry.  Psychiatric: He has a normal mood and affect. Judgment normal.  Nursing note and vitals reviewed.    ED Treatments / Results  Labs (all labs ordered are listed, but only abnormal results are displayed) Labs Reviewed  COMPREHENSIVE METABOLIC PANEL - Abnormal; Notable for the following components:      Result Value   CO2 20 (*)    Glucose, Bld 121 (*)    Creatinine, Ser 1.54 (*)    ALT 14 (*)    Total Bilirubin 1.3 (*)    GFR calc non Af Amer 41 (*)    GFR calc Af Amer 48 (*)    All other components within normal limits  LIPASE, BLOOD  CBC  URINALYSIS, ROUTINE W REFLEX MICROSCOPIC    EKG  EKG  Interpretation None       Radiology No results found.  Procedures Procedures (including critical care time)  Medications Ordered in ED Medications  ondansetron (ZOFRAN-ODT) disintegrating tablet 4 mg (4 mg Oral Given 11/19/17 1151)  ondansetron (ZOFRAN-ODT) disintegrating tablet 4 mg (4 mg Oral Given 11/19/17 1416)  sodium chloride 0.9 % bolus 1,000 mL (1,000 mLs Intravenous New Bag/Given 11/19/17 1540)  metoCLOPramide (REGLAN) injection 10 mg (10 mg Intravenous Given 11/19/17 1541)  sodium chloride 0.9 % bolus 1,000 mL (1,000 mLs Intravenous New Bag/Given 11/19/17 1542)     Initial Impression / Assessment and Plan / ED Course  I have reviewed the  triage vital signs and the nursing notes.  Pertinent labs & imaging results that were available during my care of the patient were reviewed by me and considered in my medical decision making (see chart for details).     A few days of diarrhea followed by vomiting today.  Patient well-appearing on exam, afebrile, hypertensive but remainder vital signs stable.  He had no focal abdominal tenderness and was not complaining of pain.  Labs show mild AK I with creatinine 1.54, reassuring LFTs and lipase, normal CBC.  I considered diverticulitis but patient has no focal abdominal tenderness and no leukocytosis.  Discussed option of supportive measures vs CT, pt comfortable with foregoing CT. gave IV fluids and Reglan after patient received Zofran in triage.  On reassessment he was drinking ginger ale and continued to deny any abdominal pain.  He feels comfortable going home and continuing hydration.  Discussed supportive measures including good hydration, probiotics, Zofran as needed, and PCP follow-up if diarrhea persists after 1 week.  Extensively reviewed return precautions particularly any severe abdominal pain.  He voiced understanding and was discharged in satisfactory condition.  Final Clinical Impressions(s) / ED Diagnoses   Final diagnoses:  Nausea  vomiting and diarrhea    ED Discharge Orders        Ordered    ondansetron (ZOFRAN ODT) 4 MG disintegrating tablet  Every 8 hours PRN     11/19/17 1738       Rena Hunke, Wenda Overland, MD 11/19/17 1742

## 2017-11-19 NOTE — Discharge Instructions (Signed)
START TAKING PROBIOTICS AND DRINK PLENTY OF FLUIDS. RETURN TO ER IF ANY BLOODY STOOLS, FEVER, SEVERE ABDOMINAL PAIN, WORSENING VOMITING, OR SUDDEN NEW SYMPTOMS.

## 2017-11-19 NOTE — ED Notes (Signed)
Pt. Requesting another zofran for nausea.

## 2017-11-19 NOTE — ED Notes (Signed)
Patient is alert and orientedx4.  Patient was explained discharge instructions and they understood them with no questions.  The patient's wife Addren Wegmann is taking the patient home.

## 2017-11-19 NOTE — ED Notes (Signed)
Pt has vomited x 4-5 times today, with diarrhea x 2. Cramping. Has not been around anyone who has been sick,

## 2017-11-19 NOTE — ED Triage Notes (Addendum)
Pt reports abd pain with n/v/d since Thursday. States it is lower abd, had diarrhea but now just has the sense of urgency to go but is unable. Reports feeling dehydrated and "jittery".

## 2018-02-02 DIAGNOSIS — D485 Neoplasm of uncertain behavior of skin: Secondary | ICD-10-CM | POA: Diagnosis not present

## 2018-02-02 DIAGNOSIS — C44319 Basal cell carcinoma of skin of other parts of face: Secondary | ICD-10-CM | POA: Diagnosis not present

## 2018-02-02 DIAGNOSIS — C44212 Basal cell carcinoma of skin of right ear and external auricular canal: Secondary | ICD-10-CM | POA: Diagnosis not present

## 2018-02-02 DIAGNOSIS — D1801 Hemangioma of skin and subcutaneous tissue: Secondary | ICD-10-CM | POA: Diagnosis not present

## 2018-02-02 DIAGNOSIS — L821 Other seborrheic keratosis: Secondary | ICD-10-CM | POA: Diagnosis not present

## 2018-02-02 DIAGNOSIS — L570 Actinic keratosis: Secondary | ICD-10-CM | POA: Diagnosis not present

## 2018-02-13 DIAGNOSIS — R82998 Other abnormal findings in urine: Secondary | ICD-10-CM | POA: Diagnosis not present

## 2018-02-13 DIAGNOSIS — E785 Hyperlipidemia, unspecified: Secondary | ICD-10-CM | POA: Diagnosis not present

## 2018-02-13 DIAGNOSIS — I10 Essential (primary) hypertension: Secondary | ICD-10-CM | POA: Diagnosis not present

## 2018-02-13 DIAGNOSIS — R7309 Other abnormal glucose: Secondary | ICD-10-CM | POA: Diagnosis not present

## 2018-02-13 DIAGNOSIS — E559 Vitamin D deficiency, unspecified: Secondary | ICD-10-CM | POA: Diagnosis not present

## 2018-02-17 DIAGNOSIS — Z1212 Encounter for screening for malignant neoplasm of rectum: Secondary | ICD-10-CM | POA: Diagnosis not present

## 2018-02-24 DIAGNOSIS — Z0000 Encounter for general adult medical examination without abnormal findings: Secondary | ICD-10-CM | POA: Diagnosis not present

## 2018-02-24 DIAGNOSIS — Z1389 Encounter for screening for other disorder: Secondary | ICD-10-CM | POA: Diagnosis not present

## 2018-02-24 DIAGNOSIS — M545 Low back pain: Secondary | ICD-10-CM | POA: Diagnosis not present

## 2018-02-24 DIAGNOSIS — E7849 Other hyperlipidemia: Secondary | ICD-10-CM | POA: Diagnosis not present

## 2018-02-24 DIAGNOSIS — N183 Chronic kidney disease, stage 3 (moderate): Secondary | ICD-10-CM | POA: Diagnosis not present

## 2018-02-24 DIAGNOSIS — C61 Malignant neoplasm of prostate: Secondary | ICD-10-CM | POA: Diagnosis not present

## 2018-02-24 DIAGNOSIS — R7309 Other abnormal glucose: Secondary | ICD-10-CM | POA: Diagnosis not present

## 2018-02-24 DIAGNOSIS — I10 Essential (primary) hypertension: Secondary | ICD-10-CM | POA: Diagnosis not present

## 2018-02-24 DIAGNOSIS — Z6826 Body mass index (BMI) 26.0-26.9, adult: Secondary | ICD-10-CM | POA: Diagnosis not present

## 2018-02-24 DIAGNOSIS — E559 Vitamin D deficiency, unspecified: Secondary | ICD-10-CM | POA: Diagnosis not present

## 2018-03-27 DIAGNOSIS — L905 Scar conditions and fibrosis of skin: Secondary | ICD-10-CM | POA: Diagnosis not present

## 2018-03-27 DIAGNOSIS — D485 Neoplasm of uncertain behavior of skin: Secondary | ICD-10-CM | POA: Diagnosis not present

## 2018-03-27 DIAGNOSIS — C44212 Basal cell carcinoma of skin of right ear and external auricular canal: Secondary | ICD-10-CM | POA: Diagnosis not present

## 2018-03-27 DIAGNOSIS — C44329 Squamous cell carcinoma of skin of other parts of face: Secondary | ICD-10-CM | POA: Diagnosis not present

## 2018-03-27 DIAGNOSIS — C44319 Basal cell carcinoma of skin of other parts of face: Secondary | ICD-10-CM | POA: Diagnosis not present

## 2018-04-03 DIAGNOSIS — C61 Malignant neoplasm of prostate: Secondary | ICD-10-CM | POA: Diagnosis not present

## 2018-05-22 DIAGNOSIS — C44329 Squamous cell carcinoma of skin of other parts of face: Secondary | ICD-10-CM | POA: Diagnosis not present

## 2018-07-22 DIAGNOSIS — Z23 Encounter for immunization: Secondary | ICD-10-CM | POA: Diagnosis not present

## 2018-09-05 DIAGNOSIS — H35362 Drusen (degenerative) of macula, left eye: Secondary | ICD-10-CM | POA: Diagnosis not present

## 2018-09-05 DIAGNOSIS — H35373 Puckering of macula, bilateral: Secondary | ICD-10-CM | POA: Diagnosis not present

## 2018-09-05 DIAGNOSIS — H35033 Hypertensive retinopathy, bilateral: Secondary | ICD-10-CM | POA: Diagnosis not present

## 2018-09-05 DIAGNOSIS — Z961 Presence of intraocular lens: Secondary | ICD-10-CM | POA: Diagnosis not present

## 2018-09-29 DIAGNOSIS — C61 Malignant neoplasm of prostate: Secondary | ICD-10-CM | POA: Diagnosis not present

## 2018-10-05 DIAGNOSIS — R300 Dysuria: Secondary | ICD-10-CM | POA: Diagnosis not present

## 2018-10-05 DIAGNOSIS — R8271 Bacteriuria: Secondary | ICD-10-CM | POA: Diagnosis not present

## 2018-10-05 DIAGNOSIS — Z8546 Personal history of malignant neoplasm of prostate: Secondary | ICD-10-CM | POA: Diagnosis not present

## 2019-02-19 DIAGNOSIS — I10 Essential (primary) hypertension: Secondary | ICD-10-CM | POA: Diagnosis not present

## 2019-02-19 DIAGNOSIS — R82998 Other abnormal findings in urine: Secondary | ICD-10-CM | POA: Diagnosis not present

## 2019-02-19 DIAGNOSIS — E7849 Other hyperlipidemia: Secondary | ICD-10-CM | POA: Diagnosis not present

## 2019-02-19 DIAGNOSIS — R739 Hyperglycemia, unspecified: Secondary | ICD-10-CM | POA: Diagnosis not present

## 2019-02-19 DIAGNOSIS — E559 Vitamin D deficiency, unspecified: Secondary | ICD-10-CM | POA: Diagnosis not present

## 2019-03-21 DIAGNOSIS — C61 Malignant neoplasm of prostate: Secondary | ICD-10-CM | POA: Diagnosis not present

## 2019-05-15 DIAGNOSIS — Z0120 Encounter for dental examination and cleaning without abnormal findings: Secondary | ICD-10-CM | POA: Diagnosis not present

## 2019-06-11 DIAGNOSIS — G609 Hereditary and idiopathic neuropathy, unspecified: Secondary | ICD-10-CM | POA: Diagnosis not present

## 2019-06-11 DIAGNOSIS — M25572 Pain in left ankle and joints of left foot: Secondary | ICD-10-CM | POA: Diagnosis not present

## 2019-07-03 DIAGNOSIS — L720 Epidermal cyst: Secondary | ICD-10-CM | POA: Diagnosis not present

## 2019-07-03 DIAGNOSIS — L723 Sebaceous cyst: Secondary | ICD-10-CM | POA: Diagnosis not present

## 2019-07-05 DIAGNOSIS — Z23 Encounter for immunization: Secondary | ICD-10-CM | POA: Diagnosis not present

## 2019-07-06 DIAGNOSIS — Z23 Encounter for immunization: Secondary | ICD-10-CM | POA: Diagnosis not present

## 2019-08-28 DIAGNOSIS — Z23 Encounter for immunization: Secondary | ICD-10-CM | POA: Diagnosis not present

## 2019-09-06 DIAGNOSIS — H35373 Puckering of macula, bilateral: Secondary | ICD-10-CM | POA: Diagnosis not present

## 2019-09-06 DIAGNOSIS — Z961 Presence of intraocular lens: Secondary | ICD-10-CM | POA: Diagnosis not present

## 2019-09-06 DIAGNOSIS — H35033 Hypertensive retinopathy, bilateral: Secondary | ICD-10-CM | POA: Diagnosis not present

## 2019-09-06 DIAGNOSIS — H35362 Drusen (degenerative) of macula, left eye: Secondary | ICD-10-CM | POA: Diagnosis not present

## 2019-09-11 DIAGNOSIS — L814 Other melanin hyperpigmentation: Secondary | ICD-10-CM | POA: Diagnosis not present

## 2019-09-11 DIAGNOSIS — L821 Other seborrheic keratosis: Secondary | ICD-10-CM | POA: Diagnosis not present

## 2019-09-11 DIAGNOSIS — L570 Actinic keratosis: Secondary | ICD-10-CM | POA: Diagnosis not present

## 2019-09-11 DIAGNOSIS — Z23 Encounter for immunization: Secondary | ICD-10-CM | POA: Diagnosis not present

## 2019-11-27 DIAGNOSIS — Z0120 Encounter for dental examination and cleaning without abnormal findings: Secondary | ICD-10-CM | POA: Diagnosis not present

## 2019-12-18 DIAGNOSIS — R300 Dysuria: Secondary | ICD-10-CM | POA: Diagnosis not present

## 2019-12-18 DIAGNOSIS — Z8546 Personal history of malignant neoplasm of prostate: Secondary | ICD-10-CM | POA: Diagnosis not present

## 2019-12-18 DIAGNOSIS — C61 Malignant neoplasm of prostate: Secondary | ICD-10-CM | POA: Diagnosis not present

## 2019-12-18 DIAGNOSIS — R9721 Rising PSA following treatment for malignant neoplasm of prostate: Secondary | ICD-10-CM | POA: Diagnosis not present

## 2020-02-29 DIAGNOSIS — E7849 Other hyperlipidemia: Secondary | ICD-10-CM | POA: Diagnosis not present

## 2020-02-29 DIAGNOSIS — R739 Hyperglycemia, unspecified: Secondary | ICD-10-CM | POA: Diagnosis not present

## 2020-02-29 DIAGNOSIS — Z0000 Encounter for general adult medical examination without abnormal findings: Secondary | ICD-10-CM | POA: Diagnosis not present

## 2020-02-29 DIAGNOSIS — E559 Vitamin D deficiency, unspecified: Secondary | ICD-10-CM | POA: Diagnosis not present

## 2020-04-24 DIAGNOSIS — R82998 Other abnormal findings in urine: Secondary | ICD-10-CM | POA: Diagnosis not present

## 2020-04-24 DIAGNOSIS — C61 Malignant neoplasm of prostate: Secondary | ICD-10-CM | POA: Diagnosis not present

## 2020-04-24 DIAGNOSIS — Z0000 Encounter for general adult medical examination without abnormal findings: Secondary | ICD-10-CM | POA: Diagnosis not present

## 2020-04-24 DIAGNOSIS — J302 Other seasonal allergic rhinitis: Secondary | ICD-10-CM | POA: Diagnosis not present

## 2020-04-24 DIAGNOSIS — M545 Low back pain: Secondary | ICD-10-CM | POA: Diagnosis not present

## 2020-04-24 DIAGNOSIS — I10 Essential (primary) hypertension: Secondary | ICD-10-CM | POA: Diagnosis not present

## 2020-06-04 DIAGNOSIS — Z0120 Encounter for dental examination and cleaning without abnormal findings: Secondary | ICD-10-CM | POA: Diagnosis not present

## 2020-07-10 DIAGNOSIS — R350 Frequency of micturition: Secondary | ICD-10-CM | POA: Diagnosis not present

## 2020-07-10 DIAGNOSIS — N401 Enlarged prostate with lower urinary tract symptoms: Secondary | ICD-10-CM | POA: Diagnosis not present

## 2020-07-10 DIAGNOSIS — C61 Malignant neoplasm of prostate: Secondary | ICD-10-CM | POA: Diagnosis not present

## 2020-07-11 DIAGNOSIS — H43813 Vitreous degeneration, bilateral: Secondary | ICD-10-CM | POA: Diagnosis not present

## 2020-08-16 DIAGNOSIS — Z23 Encounter for immunization: Secondary | ICD-10-CM | POA: Diagnosis not present

## 2020-09-05 DIAGNOSIS — Z961 Presence of intraocular lens: Secondary | ICD-10-CM | POA: Diagnosis not present

## 2020-09-05 DIAGNOSIS — H43813 Vitreous degeneration, bilateral: Secondary | ICD-10-CM | POA: Diagnosis not present

## 2020-09-05 DIAGNOSIS — H35033 Hypertensive retinopathy, bilateral: Secondary | ICD-10-CM | POA: Diagnosis not present

## 2020-09-05 DIAGNOSIS — H35373 Puckering of macula, bilateral: Secondary | ICD-10-CM | POA: Diagnosis not present

## 2020-09-10 DIAGNOSIS — L814 Other melanin hyperpigmentation: Secondary | ICD-10-CM | POA: Diagnosis not present

## 2020-09-10 DIAGNOSIS — L821 Other seborrheic keratosis: Secondary | ICD-10-CM | POA: Diagnosis not present

## 2020-09-10 DIAGNOSIS — Z85828 Personal history of other malignant neoplasm of skin: Secondary | ICD-10-CM | POA: Diagnosis not present

## 2020-09-10 DIAGNOSIS — D225 Melanocytic nevi of trunk: Secondary | ICD-10-CM | POA: Diagnosis not present

## 2020-12-24 DIAGNOSIS — C61 Malignant neoplasm of prostate: Secondary | ICD-10-CM | POA: Diagnosis not present

## 2020-12-31 DIAGNOSIS — C61 Malignant neoplasm of prostate: Secondary | ICD-10-CM | POA: Diagnosis not present

## 2021-03-20 DIAGNOSIS — M545 Low back pain, unspecified: Secondary | ICD-10-CM | POA: Diagnosis not present

## 2021-03-26 DIAGNOSIS — M545 Low back pain, unspecified: Secondary | ICD-10-CM | POA: Diagnosis not present

## 2021-03-30 DIAGNOSIS — M545 Low back pain, unspecified: Secondary | ICD-10-CM | POA: Diagnosis not present

## 2021-04-08 DIAGNOSIS — M545 Low back pain, unspecified: Secondary | ICD-10-CM | POA: Diagnosis not present

## 2021-04-16 DIAGNOSIS — M545 Low back pain, unspecified: Secondary | ICD-10-CM | POA: Diagnosis not present

## 2021-04-20 DIAGNOSIS — M545 Low back pain, unspecified: Secondary | ICD-10-CM | POA: Diagnosis not present

## 2021-04-24 DIAGNOSIS — M545 Low back pain, unspecified: Secondary | ICD-10-CM | POA: Diagnosis not present

## 2021-04-27 DIAGNOSIS — E559 Vitamin D deficiency, unspecified: Secondary | ICD-10-CM | POA: Diagnosis not present

## 2021-04-27 DIAGNOSIS — Z0000 Encounter for general adult medical examination without abnormal findings: Secondary | ICD-10-CM | POA: Diagnosis not present

## 2021-04-27 DIAGNOSIS — E785 Hyperlipidemia, unspecified: Secondary | ICD-10-CM | POA: Diagnosis not present

## 2021-04-27 DIAGNOSIS — Z125 Encounter for screening for malignant neoplasm of prostate: Secondary | ICD-10-CM | POA: Diagnosis not present

## 2021-05-04 DIAGNOSIS — I10 Essential (primary) hypertension: Secondary | ICD-10-CM | POA: Diagnosis not present

## 2021-05-04 DIAGNOSIS — Z0000 Encounter for general adult medical examination without abnormal findings: Secondary | ICD-10-CM | POA: Diagnosis not present

## 2021-05-04 DIAGNOSIS — I129 Hypertensive chronic kidney disease with stage 1 through stage 4 chronic kidney disease, or unspecified chronic kidney disease: Secondary | ICD-10-CM | POA: Diagnosis not present

## 2021-05-04 DIAGNOSIS — R82998 Other abnormal findings in urine: Secondary | ICD-10-CM | POA: Diagnosis not present

## 2021-05-04 DIAGNOSIS — E785 Hyperlipidemia, unspecified: Secondary | ICD-10-CM | POA: Diagnosis not present

## 2021-05-04 DIAGNOSIS — N1831 Chronic kidney disease, stage 3a: Secondary | ICD-10-CM | POA: Diagnosis not present

## 2021-05-20 DIAGNOSIS — M545 Low back pain, unspecified: Secondary | ICD-10-CM | POA: Diagnosis not present

## 2021-06-29 DIAGNOSIS — M545 Low back pain, unspecified: Secondary | ICD-10-CM | POA: Diagnosis not present

## 2021-07-29 DIAGNOSIS — M545 Low back pain, unspecified: Secondary | ICD-10-CM | POA: Diagnosis not present

## 2021-08-15 DIAGNOSIS — Z23 Encounter for immunization: Secondary | ICD-10-CM | POA: Diagnosis not present

## 2021-08-26 DIAGNOSIS — M545 Low back pain, unspecified: Secondary | ICD-10-CM | POA: Diagnosis not present

## 2021-09-04 DIAGNOSIS — H43813 Vitreous degeneration, bilateral: Secondary | ICD-10-CM | POA: Diagnosis not present

## 2021-09-04 DIAGNOSIS — H35373 Puckering of macula, bilateral: Secondary | ICD-10-CM | POA: Diagnosis not present

## 2021-09-04 DIAGNOSIS — H35033 Hypertensive retinopathy, bilateral: Secondary | ICD-10-CM | POA: Diagnosis not present

## 2021-09-04 DIAGNOSIS — Z961 Presence of intraocular lens: Secondary | ICD-10-CM | POA: Diagnosis not present

## 2021-09-16 DIAGNOSIS — L821 Other seborrheic keratosis: Secondary | ICD-10-CM | POA: Diagnosis not present

## 2021-09-16 DIAGNOSIS — Z85828 Personal history of other malignant neoplasm of skin: Secondary | ICD-10-CM | POA: Diagnosis not present

## 2021-09-16 DIAGNOSIS — D225 Melanocytic nevi of trunk: Secondary | ICD-10-CM | POA: Diagnosis not present

## 2021-09-16 DIAGNOSIS — L814 Other melanin hyperpigmentation: Secondary | ICD-10-CM | POA: Diagnosis not present

## 2021-09-30 DIAGNOSIS — M545 Low back pain, unspecified: Secondary | ICD-10-CM | POA: Diagnosis not present

## 2021-10-14 DIAGNOSIS — L309 Dermatitis, unspecified: Secondary | ICD-10-CM | POA: Diagnosis not present

## 2021-10-14 DIAGNOSIS — L739 Follicular disorder, unspecified: Secondary | ICD-10-CM | POA: Diagnosis not present

## 2021-10-14 DIAGNOSIS — L821 Other seborrheic keratosis: Secondary | ICD-10-CM | POA: Diagnosis not present

## 2021-10-14 DIAGNOSIS — R21 Rash and other nonspecific skin eruption: Secondary | ICD-10-CM | POA: Diagnosis not present

## 2021-12-24 DIAGNOSIS — C61 Malignant neoplasm of prostate: Secondary | ICD-10-CM | POA: Diagnosis not present

## 2021-12-31 DIAGNOSIS — R3911 Hesitancy of micturition: Secondary | ICD-10-CM | POA: Diagnosis not present

## 2021-12-31 DIAGNOSIS — C61 Malignant neoplasm of prostate: Secondary | ICD-10-CM | POA: Diagnosis not present

## 2021-12-31 DIAGNOSIS — N401 Enlarged prostate with lower urinary tract symptoms: Secondary | ICD-10-CM | POA: Diagnosis not present

## 2022-06-15 DIAGNOSIS — I10 Essential (primary) hypertension: Secondary | ICD-10-CM | POA: Diagnosis not present

## 2022-06-15 DIAGNOSIS — N1831 Chronic kidney disease, stage 3a: Secondary | ICD-10-CM | POA: Diagnosis not present

## 2022-06-15 DIAGNOSIS — E559 Vitamin D deficiency, unspecified: Secondary | ICD-10-CM | POA: Diagnosis not present

## 2022-06-15 DIAGNOSIS — E785 Hyperlipidemia, unspecified: Secondary | ICD-10-CM | POA: Diagnosis not present

## 2022-06-22 DIAGNOSIS — I129 Hypertensive chronic kidney disease with stage 1 through stage 4 chronic kidney disease, or unspecified chronic kidney disease: Secondary | ICD-10-CM | POA: Diagnosis not present

## 2022-06-22 DIAGNOSIS — N1831 Chronic kidney disease, stage 3a: Secondary | ICD-10-CM | POA: Diagnosis not present

## 2022-06-22 DIAGNOSIS — Z0000 Encounter for general adult medical examination without abnormal findings: Secondary | ICD-10-CM | POA: Diagnosis not present

## 2022-06-22 DIAGNOSIS — R82998 Other abnormal findings in urine: Secondary | ICD-10-CM | POA: Diagnosis not present

## 2022-06-22 DIAGNOSIS — M545 Low back pain, unspecified: Secondary | ICD-10-CM | POA: Diagnosis not present

## 2022-09-04 DIAGNOSIS — Z23 Encounter for immunization: Secondary | ICD-10-CM | POA: Diagnosis not present

## 2022-09-21 DIAGNOSIS — Z961 Presence of intraocular lens: Secondary | ICD-10-CM | POA: Diagnosis not present

## 2022-09-21 DIAGNOSIS — H43393 Other vitreous opacities, bilateral: Secondary | ICD-10-CM | POA: Diagnosis not present

## 2022-09-21 DIAGNOSIS — H43813 Vitreous degeneration, bilateral: Secondary | ICD-10-CM | POA: Diagnosis not present

## 2022-09-21 DIAGNOSIS — H02831 Dermatochalasis of right upper eyelid: Secondary | ICD-10-CM | POA: Diagnosis not present

## 2022-10-04 DIAGNOSIS — L814 Other melanin hyperpigmentation: Secondary | ICD-10-CM | POA: Diagnosis not present

## 2022-10-04 DIAGNOSIS — L821 Other seborrheic keratosis: Secondary | ICD-10-CM | POA: Diagnosis not present

## 2022-10-04 DIAGNOSIS — D225 Melanocytic nevi of trunk: Secondary | ICD-10-CM | POA: Diagnosis not present

## 2022-10-04 DIAGNOSIS — Z85828 Personal history of other malignant neoplasm of skin: Secondary | ICD-10-CM | POA: Diagnosis not present

## 2022-12-22 DIAGNOSIS — C61 Malignant neoplasm of prostate: Secondary | ICD-10-CM | POA: Diagnosis not present

## 2022-12-29 DIAGNOSIS — N401 Enlarged prostate with lower urinary tract symptoms: Secondary | ICD-10-CM | POA: Diagnosis not present

## 2022-12-29 DIAGNOSIS — R3911 Hesitancy of micturition: Secondary | ICD-10-CM | POA: Diagnosis not present

## 2022-12-29 DIAGNOSIS — C61 Malignant neoplasm of prostate: Secondary | ICD-10-CM | POA: Diagnosis not present

## 2023-02-22 DIAGNOSIS — K080 Exfoliation of teeth due to systemic causes: Secondary | ICD-10-CM | POA: Diagnosis not present

## 2023-06-20 DIAGNOSIS — C61 Malignant neoplasm of prostate: Secondary | ICD-10-CM | POA: Diagnosis not present

## 2023-06-28 DIAGNOSIS — R3911 Hesitancy of micturition: Secondary | ICD-10-CM | POA: Diagnosis not present

## 2023-06-28 DIAGNOSIS — N401 Enlarged prostate with lower urinary tract symptoms: Secondary | ICD-10-CM | POA: Diagnosis not present

## 2023-06-28 DIAGNOSIS — C61 Malignant neoplasm of prostate: Secondary | ICD-10-CM | POA: Diagnosis not present

## 2023-08-26 DIAGNOSIS — E559 Vitamin D deficiency, unspecified: Secondary | ICD-10-CM | POA: Diagnosis not present

## 2023-08-26 DIAGNOSIS — R7301 Impaired fasting glucose: Secondary | ICD-10-CM | POA: Diagnosis not present

## 2023-08-26 DIAGNOSIS — E785 Hyperlipidemia, unspecified: Secondary | ICD-10-CM | POA: Diagnosis not present

## 2023-08-26 DIAGNOSIS — I10 Essential (primary) hypertension: Secondary | ICD-10-CM | POA: Diagnosis not present

## 2023-08-26 DIAGNOSIS — C61 Malignant neoplasm of prostate: Secondary | ICD-10-CM | POA: Diagnosis not present

## 2023-08-27 DIAGNOSIS — Z23 Encounter for immunization: Secondary | ICD-10-CM | POA: Diagnosis not present

## 2023-09-01 DIAGNOSIS — I129 Hypertensive chronic kidney disease with stage 1 through stage 4 chronic kidney disease, or unspecified chronic kidney disease: Secondary | ICD-10-CM | POA: Diagnosis not present

## 2023-09-01 DIAGNOSIS — Z1331 Encounter for screening for depression: Secondary | ICD-10-CM | POA: Diagnosis not present

## 2023-09-01 DIAGNOSIS — R82998 Other abnormal findings in urine: Secondary | ICD-10-CM | POA: Diagnosis not present

## 2023-09-01 DIAGNOSIS — Z0000 Encounter for general adult medical examination without abnormal findings: Secondary | ICD-10-CM | POA: Diagnosis not present

## 2023-09-01 DIAGNOSIS — Z1339 Encounter for screening examination for other mental health and behavioral disorders: Secondary | ICD-10-CM | POA: Diagnosis not present

## 2023-09-01 DIAGNOSIS — I10 Essential (primary) hypertension: Secondary | ICD-10-CM | POA: Diagnosis not present

## 2023-09-06 DIAGNOSIS — K080 Exfoliation of teeth due to systemic causes: Secondary | ICD-10-CM | POA: Diagnosis not present

## 2023-09-27 DIAGNOSIS — Z23 Encounter for immunization: Secondary | ICD-10-CM | POA: Diagnosis not present

## 2023-10-03 DIAGNOSIS — H02831 Dermatochalasis of right upper eyelid: Secondary | ICD-10-CM | POA: Diagnosis not present

## 2023-10-03 DIAGNOSIS — H43813 Vitreous degeneration, bilateral: Secondary | ICD-10-CM | POA: Diagnosis not present

## 2023-10-03 DIAGNOSIS — Z961 Presence of intraocular lens: Secondary | ICD-10-CM | POA: Diagnosis not present

## 2023-10-03 DIAGNOSIS — H179 Unspecified corneal scar and opacity: Secondary | ICD-10-CM | POA: Diagnosis not present

## 2023-10-18 DIAGNOSIS — M7711 Lateral epicondylitis, right elbow: Secondary | ICD-10-CM | POA: Diagnosis not present

## 2023-10-18 DIAGNOSIS — M1812 Unilateral primary osteoarthritis of first carpometacarpal joint, left hand: Secondary | ICD-10-CM | POA: Diagnosis not present

## 2023-10-18 DIAGNOSIS — M25522 Pain in left elbow: Secondary | ICD-10-CM | POA: Diagnosis not present

## 2023-10-24 DIAGNOSIS — Z85828 Personal history of other malignant neoplasm of skin: Secondary | ICD-10-CM | POA: Diagnosis not present

## 2023-10-24 DIAGNOSIS — L814 Other melanin hyperpigmentation: Secondary | ICD-10-CM | POA: Diagnosis not present

## 2023-10-24 DIAGNOSIS — L821 Other seborrheic keratosis: Secondary | ICD-10-CM | POA: Diagnosis not present

## 2023-10-24 DIAGNOSIS — D225 Melanocytic nevi of trunk: Secondary | ICD-10-CM | POA: Diagnosis not present

## 2023-12-26 DIAGNOSIS — N401 Enlarged prostate with lower urinary tract symptoms: Secondary | ICD-10-CM | POA: Diagnosis not present

## 2023-12-26 DIAGNOSIS — R310 Gross hematuria: Secondary | ICD-10-CM | POA: Diagnosis not present

## 2023-12-26 DIAGNOSIS — C61 Malignant neoplasm of prostate: Secondary | ICD-10-CM | POA: Diagnosis not present

## 2023-12-26 DIAGNOSIS — R3911 Hesitancy of micturition: Secondary | ICD-10-CM | POA: Diagnosis not present

## 2023-12-30 ENCOUNTER — Other Ambulatory Visit (HOSPITAL_COMMUNITY): Payer: Self-pay | Admitting: Urology

## 2023-12-30 DIAGNOSIS — C61 Malignant neoplasm of prostate: Secondary | ICD-10-CM

## 2024-01-03 DIAGNOSIS — L309 Dermatitis, unspecified: Secondary | ICD-10-CM | POA: Diagnosis not present

## 2024-01-17 ENCOUNTER — Ambulatory Visit (HOSPITAL_COMMUNITY)
Admission: RE | Admit: 2024-01-17 | Discharge: 2024-01-17 | Disposition: A | Discharge status: Home / Self Care | Payer: Medicare Other | Source: Ambulatory Visit | Attending: Urology

## 2024-01-17 ENCOUNTER — Ambulatory Visit (HOSPITAL_COMMUNITY)
Admission: RE | Admit: 2024-01-17 | Discharge: 2024-01-17 | Disposition: A | Discharge status: Home / Self Care | Payer: Medicare Other | Source: Ambulatory Visit | Attending: Urology | Admitting: Urology

## 2024-01-17 ENCOUNTER — Encounter (HOSPITAL_COMMUNITY): Payer: Self-pay

## 2024-01-17 DIAGNOSIS — C61 Malignant neoplasm of prostate: Secondary | ICD-10-CM | POA: Diagnosis not present

## 2024-01-17 DIAGNOSIS — K449 Diaphragmatic hernia without obstruction or gangrene: Secondary | ICD-10-CM | POA: Diagnosis not present

## 2024-01-17 DIAGNOSIS — K5730 Diverticulosis of large intestine without perforation or abscess without bleeding: Secondary | ICD-10-CM | POA: Diagnosis not present

## 2024-01-17 DIAGNOSIS — R319 Hematuria, unspecified: Secondary | ICD-10-CM | POA: Diagnosis not present

## 2024-01-17 LAB — POCT I-STAT CREATININE: Creatinine, Ser: 1.5 mg/dL — ABNORMAL HIGH (ref 0.61–1.24)

## 2024-01-17 MED ORDER — IOHEXOL 300 MG/ML  SOLN
100.0000 mL | Freq: Once | INTRAMUSCULAR | Status: AC | PRN
  Administered 2024-01-17: 100 mL via INTRAVENOUS

## 2024-01-17 MED ORDER — SODIUM CHLORIDE 0.9 % IV SOLN
INTRAVENOUS | Status: AC
  Filled 2024-01-17: qty 250

## 2024-01-17 MED ORDER — TECHNETIUM TC 99M MEDRONATE IV KIT
20.0000 | PACK | Freq: Once | INTRAVENOUS | Status: AC
  Administered 2024-01-17: 21.1 via INTRAVENOUS

## 2024-02-08 DIAGNOSIS — R3911 Hesitancy of micturition: Secondary | ICD-10-CM | POA: Diagnosis not present

## 2024-02-08 DIAGNOSIS — N401 Enlarged prostate with lower urinary tract symptoms: Secondary | ICD-10-CM | POA: Diagnosis not present

## 2024-02-08 DIAGNOSIS — R310 Gross hematuria: Secondary | ICD-10-CM | POA: Diagnosis not present

## 2024-02-08 DIAGNOSIS — C61 Malignant neoplasm of prostate: Secondary | ICD-10-CM | POA: Diagnosis not present

## 2024-03-14 DIAGNOSIS — M545 Low back pain, unspecified: Secondary | ICD-10-CM | POA: Diagnosis not present

## 2024-04-03 DIAGNOSIS — K080 Exfoliation of teeth due to systemic causes: Secondary | ICD-10-CM | POA: Diagnosis not present

## 2024-04-23 DIAGNOSIS — C61 Malignant neoplasm of prostate: Secondary | ICD-10-CM | POA: Diagnosis not present

## 2024-05-01 DIAGNOSIS — C61 Malignant neoplasm of prostate: Secondary | ICD-10-CM | POA: Diagnosis not present

## 2024-05-01 DIAGNOSIS — R3911 Hesitancy of micturition: Secondary | ICD-10-CM | POA: Diagnosis not present

## 2024-05-01 DIAGNOSIS — N401 Enlarged prostate with lower urinary tract symptoms: Secondary | ICD-10-CM | POA: Diagnosis not present

## 2024-07-03 DIAGNOSIS — K4020 Bilateral inguinal hernia, without obstruction or gangrene, not specified as recurrent: Secondary | ICD-10-CM | POA: Diagnosis not present

## 2024-07-09 DIAGNOSIS — K4021 Bilateral inguinal hernia, without obstruction or gangrene, recurrent: Secondary | ICD-10-CM | POA: Diagnosis not present

## 2024-07-31 DIAGNOSIS — Z23 Encounter for immunization: Secondary | ICD-10-CM | POA: Diagnosis not present

## 2024-09-10 DIAGNOSIS — Z125 Encounter for screening for malignant neoplasm of prostate: Secondary | ICD-10-CM | POA: Diagnosis not present

## 2024-09-10 DIAGNOSIS — I129 Hypertensive chronic kidney disease with stage 1 through stage 4 chronic kidney disease, or unspecified chronic kidney disease: Secondary | ICD-10-CM | POA: Diagnosis not present

## 2024-09-10 DIAGNOSIS — E559 Vitamin D deficiency, unspecified: Secondary | ICD-10-CM | POA: Diagnosis not present

## 2024-09-10 DIAGNOSIS — R739 Hyperglycemia, unspecified: Secondary | ICD-10-CM | POA: Diagnosis not present

## 2024-09-17 DIAGNOSIS — Z1339 Encounter for screening examination for other mental health and behavioral disorders: Secondary | ICD-10-CM | POA: Diagnosis not present

## 2024-09-17 DIAGNOSIS — Z0000 Encounter for general adult medical examination without abnormal findings: Secondary | ICD-10-CM | POA: Diagnosis not present

## 2024-09-17 DIAGNOSIS — I10 Essential (primary) hypertension: Secondary | ICD-10-CM | POA: Diagnosis not present

## 2024-09-17 DIAGNOSIS — Z1331 Encounter for screening for depression: Secondary | ICD-10-CM | POA: Diagnosis not present

## 2024-09-17 DIAGNOSIS — R82998 Other abnormal findings in urine: Secondary | ICD-10-CM | POA: Diagnosis not present

## 2024-09-17 DIAGNOSIS — N1831 Chronic kidney disease, stage 3a: Secondary | ICD-10-CM | POA: Diagnosis not present

## 2024-10-02 DIAGNOSIS — H179 Unspecified corneal scar and opacity: Secondary | ICD-10-CM | POA: Diagnosis not present

## 2024-10-02 DIAGNOSIS — H0100A Unspecified blepharitis right eye, upper and lower eyelids: Secondary | ICD-10-CM | POA: Diagnosis not present

## 2024-10-02 DIAGNOSIS — H0100B Unspecified blepharitis left eye, upper and lower eyelids: Secondary | ICD-10-CM | POA: Diagnosis not present

## 2024-10-24 DIAGNOSIS — C61 Malignant neoplasm of prostate: Secondary | ICD-10-CM | POA: Diagnosis not present

## 2024-10-30 DIAGNOSIS — K080 Exfoliation of teeth due to systemic causes: Secondary | ICD-10-CM | POA: Diagnosis not present

## 2024-10-31 DIAGNOSIS — C61 Malignant neoplasm of prostate: Secondary | ICD-10-CM | POA: Diagnosis not present

## 2024-10-31 DIAGNOSIS — N401 Enlarged prostate with lower urinary tract symptoms: Secondary | ICD-10-CM | POA: Diagnosis not present

## 2024-10-31 DIAGNOSIS — R350 Frequency of micturition: Secondary | ICD-10-CM | POA: Diagnosis not present

## 2024-10-31 DIAGNOSIS — R3912 Poor urinary stream: Secondary | ICD-10-CM | POA: Diagnosis not present

## 2024-10-31 DIAGNOSIS — R351 Nocturia: Secondary | ICD-10-CM | POA: Diagnosis not present
